# Patient Record
Sex: Female | Born: 1955 | Race: White | Hispanic: No | Marital: Married | Smoking: Never smoker
Health system: Southern US, Community
[De-identification: ages and names within clinical notes are randomized; demographics above are authoritative.]

## PROBLEM LIST (undated history)

## (undated) DIAGNOSIS — M1990 Unspecified osteoarthritis, unspecified site: Secondary | ICD-10-CM

## (undated) DIAGNOSIS — K5790 Diverticulosis of intestine, part unspecified, without perforation or abscess without bleeding: Secondary | ICD-10-CM

## (undated) DIAGNOSIS — R002 Palpitations: Secondary | ICD-10-CM

## (undated) DIAGNOSIS — R0789 Other chest pain: Secondary | ICD-10-CM

## (undated) DIAGNOSIS — T7840XA Allergy, unspecified, initial encounter: Secondary | ICD-10-CM

## (undated) DIAGNOSIS — I10 Essential (primary) hypertension: Secondary | ICD-10-CM

## (undated) DIAGNOSIS — M797 Fibromyalgia: Secondary | ICD-10-CM

## (undated) HISTORY — DX: Other chest pain: R07.89

## (undated) HISTORY — DX: Fibromyalgia: M79.7

## (undated) HISTORY — DX: Essential (primary) hypertension: I10

## (undated) HISTORY — DX: Allergy, unspecified, initial encounter: T78.40XA

## (undated) HISTORY — DX: Palpitations: R00.2

## (undated) HISTORY — DX: Unspecified osteoarthritis, unspecified site: M19.90

## (undated) HISTORY — DX: Diverticulosis of intestine, part unspecified, without perforation or abscess without bleeding: K57.90

## (undated) HISTORY — PX: WISDOM TOOTH EXTRACTION: SHX21

## (undated) HISTORY — PX: CATARACT EXTRACTION: SUR2

## (undated) HISTORY — PX: CHOLECYSTECTOMY: SHX55

---

## 1999-04-27 ENCOUNTER — Emergency Department (HOSPITAL_COMMUNITY): Admission: EM | Admit: 1999-04-27 | Discharge: 1999-04-27 | Payer: Self-pay | Admitting: Internal Medicine

## 1999-11-27 ENCOUNTER — Other Ambulatory Visit: Admission: RE | Admit: 1999-11-27 | Discharge: 1999-11-27 | Payer: Self-pay | Admitting: Obstetrics and Gynecology

## 2002-04-21 ENCOUNTER — Other Ambulatory Visit: Admission: RE | Admit: 2002-04-21 | Discharge: 2002-04-21 | Payer: Self-pay | Admitting: Obstetrics and Gynecology

## 2002-05-03 ENCOUNTER — Ambulatory Visit (HOSPITAL_COMMUNITY): Admission: RE | Admit: 2002-05-03 | Discharge: 2002-05-03 | Payer: Self-pay | Admitting: Gastroenterology

## 2002-05-19 ENCOUNTER — Encounter: Admission: RE | Admit: 2002-05-19 | Discharge: 2002-05-19 | Payer: Self-pay | Admitting: Gastroenterology

## 2002-05-19 ENCOUNTER — Encounter: Payer: Self-pay | Admitting: Gastroenterology

## 2003-04-05 ENCOUNTER — Ambulatory Visit (HOSPITAL_COMMUNITY): Admission: RE | Admit: 2003-04-05 | Discharge: 2003-04-05 | Payer: Self-pay | Admitting: Gastroenterology

## 2003-05-04 ENCOUNTER — Encounter: Payer: Self-pay | Admitting: Gastroenterology

## 2003-05-04 ENCOUNTER — Encounter: Admission: RE | Admit: 2003-05-04 | Discharge: 2003-05-04 | Payer: Self-pay | Admitting: Gastroenterology

## 2003-09-26 ENCOUNTER — Encounter (INDEPENDENT_AMBULATORY_CARE_PROVIDER_SITE_OTHER): Payer: Self-pay

## 2003-09-26 ENCOUNTER — Observation Stay (HOSPITAL_COMMUNITY): Admission: RE | Admit: 2003-09-26 | Discharge: 2003-09-27 | Payer: Self-pay | Admitting: Surgery

## 2003-09-26 ENCOUNTER — Encounter: Payer: Self-pay | Admitting: Surgery

## 2005-01-30 ENCOUNTER — Other Ambulatory Visit: Admission: RE | Admit: 2005-01-30 | Discharge: 2005-01-30 | Payer: Self-pay | Admitting: Obstetrics and Gynecology

## 2005-06-21 ENCOUNTER — Encounter: Admission: RE | Admit: 2005-06-21 | Discharge: 2005-06-21 | Payer: Self-pay | Admitting: Rheumatology

## 2006-01-19 ENCOUNTER — Encounter: Admission: RE | Admit: 2006-01-19 | Discharge: 2006-01-19 | Payer: Self-pay | Admitting: Neurological Surgery

## 2006-02-04 ENCOUNTER — Encounter: Admission: RE | Admit: 2006-02-04 | Discharge: 2006-02-04 | Payer: Self-pay | Admitting: Neurological Surgery

## 2006-02-12 ENCOUNTER — Other Ambulatory Visit: Admission: RE | Admit: 2006-02-12 | Discharge: 2006-02-12 | Payer: Self-pay | Admitting: Obstetrics and Gynecology

## 2006-02-17 ENCOUNTER — Ambulatory Visit (HOSPITAL_COMMUNITY): Admission: RE | Admit: 2006-02-17 | Discharge: 2006-02-17 | Payer: Self-pay | Admitting: Obstetrics and Gynecology

## 2006-03-31 ENCOUNTER — Encounter: Admission: RE | Admit: 2006-03-31 | Discharge: 2006-03-31 | Payer: Self-pay | Admitting: Gastroenterology

## 2007-10-29 ENCOUNTER — Encounter: Admission: RE | Admit: 2007-10-29 | Discharge: 2007-10-29 | Payer: Self-pay | Admitting: Obstetrics and Gynecology

## 2010-04-19 ENCOUNTER — Encounter: Admission: RE | Admit: 2010-04-19 | Discharge: 2010-04-19 | Payer: Self-pay | Admitting: Obstetrics and Gynecology

## 2011-01-12 ENCOUNTER — Encounter: Payer: Self-pay | Admitting: Obstetrics and Gynecology

## 2011-01-12 ENCOUNTER — Encounter: Payer: Self-pay | Admitting: Internal Medicine

## 2011-01-13 ENCOUNTER — Encounter: Payer: Self-pay | Admitting: Obstetrics and Gynecology

## 2011-05-09 NOTE — Op Note (Signed)
   NAME:  Jody Garcia, Jody Garcia                       ACCOUNT NO.:  [id number]   MEDICAL RECORD NO.:  [id number]                   PATIENT TYPE:  AMB   LOCATION:  ENDO                                 FACILITY:   PHYSICIAN:  Anselmo Rod, M.D.               DATE OF BIRTH:  Oct 16, 1956   DATE OF PROCEDURE:  04/05/2003  DATE OF DISCHARGE:                                 OPERATIVE REPORT   PROCEDURE:  Esophagogastroduodenoscopy.   ENDOSCOPIST:  Charna Elizabeth, M.D.   INSTRUMENT USED:  Olympus video panendoscope.   INDICATIONS FOR PROCEDURE:  A 55 year old white female with history of  severe reflux and chest pain, rule out esophagitis, peptic ulcer disease,  etc.   PREPROCEDURE PREPARATION:  Informed consent was procured from the patient.  The patient was fasted for eight hours prior to the procedure.   PREPROCEDURE PHYSICAL:  VITAL SIGNS:  The patient had stable vital signs.  NECK:  Supple.  CHEST:  Clear to auscultation.  CARDIAC:  S1 and S2 regular.  ABDOMEN:  Soft with normal bowel sounds.   DESCRIPTION OF PROCEDURE:  The patient was placed in the left lateral  decubitus position and sedated with 60 mg of Demerol and 6 mg of Versed  intravenously.  Once the patient was adequately sedated, maintained on low  flow oxygen and continuous cardiac monitoring, the Olympus video  panendoscope was advance through the mouth piece over the tongue, into the  esophagus under direct vision.  The entire esophagus appeared normal with no  evidence of rings, stricture, masses, esophagitis or Barrett's mucosa.  The  scope was then advanced into the stomach.  The entire gastric mucosa and the  proximal small bowel appeared normal with no evidence of abnormalities on  high retroflexion.   IMPRESSION:  Normal esophagogastroduodenoscopy.   RECOMMENDATIONS:  1. Continue present medications including Aciphex 20 mg b.i.d.  2. Trial of Librax is being recommended one p.o. b.i.d. #60 with one refill.    These have been called in to the     patient's pharmacy to help with IBS and fibromyalgia.  3. Rheumatologic evaluation by Dr. Pollyann Savoy in the near future.  4. Outpatient followup in the next four weeks for further recommendations.                                               Anselmo Rod, M.D.    JNM/MEDQ  D:  04/05/2003  T:  04/06/2003  Job:  [id number]   cc:   Dois Davenport A. Rivard, M.D.  [address]  [place]  [place] [address]  Fax: [phone]   Pollyann Savoy, M.D.  [address]  [place], [place] [address]  Fax: [phone]

## 2011-05-09 NOTE — Op Note (Signed)
NAME:  Jody Garcia, Jody Garcia                       ACCOUNT NO.:  [id number]   MEDICAL RECORD NO.:  [id number]                   PATIENT TYPE:  AMB   LOCATION:  DAY                                  FACILITY:  [facility]   PHYSICIAN:  Abigail Miyamoto, M.D.              DATE OF BIRTH:  [birth year]   DATE OF PROCEDURE:  09/26/2003  DATE OF DISCHARGE:                                 OPERATIVE REPORT   PREOPERATIVE DIAGNOSES:  Biliary dyskinesia and gallbladder polyp with  abdominal pain.   POSTOPERATIVE DIAGNOSES:  Biliary dyskinesia and gallbladder polyp with  abdominal pain.   PROCEDURE:  Laparoscopic cholecystectomy with intraoperative cholangiogram.   SURGEON:  Abigail Miyamoto, M.D.   ASSISTANT:  Sharlet Salina T. Hoxworth, M.D.   ANESTHESIA:  General endotracheal anesthesia.   ESTIMATED BLOOD LOSS:  Minimal.   FINDINGS:  The patient was found the have a floppy chronically scarred  appearing gallbladder. Cholangiogram was normal.   DESCRIPTION OF PROCEDURE:  The patient was brought to the operating room,  identified as Jody Garcia. She was placed supine on the operating table  and general anesthesia was induced. Her abdomen was then prepped and draped  in the usual sterile fashion. Using a #15 blade, a small transverse incision  was made below the umbilicus. The incision was carried down to the fascia  which was then opened with the scalpel. A hemostat was then used to pass  through the peritoneal cavity. Next, a #0 Vicryl pursestring suture was  placed around the fascial opening. The Hasson port was placed through the  opening and insufflation of the abdomen was begun. Next, a 12 mm port was  placed in the patient's epigastrium and two 5 mm ports were placed in the  patient's right flank under direct vision. The gallbladder was then  visualized. It was found to be quite large and floppy. It was grasped and  elevated above the liver bed. Several adhesions to the gallbladder were  then  taken down bluntly. The cystic duct was then dissected out and clipped once  distally. It was then partly transected with the scissors. An angiocatheter  was then inserted under direct vision in the right upper quadrant. The  cholangiocatheter was passed through this and placed into the opening of the  cystic duct. A cholangiogram was then performed with contrast under direct  fluoroscopy. Contrast was seen to flow into the entire biliary system  without evidence of  abnormality. The cholangiocatheter was then removed.  The cystic duct was then clipped four times proximally and then transected  with scissors. The cystic artery was identified, clipped twice proximally,  once distally and transected as well. The gallbladder was then dissected  free from the liver bed with the electrocautery. Once the gallbladder was  free from the liver bed, hemostasis was achieved with the cautery. The  abdomen was then irrigated with normal saline. Again hemostasis appeared to  be achieved.  All ports were then removed under direct vision and the abdomen  was deflated. All incisions were then anesthetized with 0.25% Marcaine and  closed with 4-0 Monocryl subcuticular sutures. I did  put another #0 Vicryl at the epigastric port in the fascia. Steri-Strips,  gauze and tape were then applied. The patient tolerated the procedure well.  All sponge, needle and instrument counts were correct at the end of the  procedure. The patient was then extubated in the operating room and taken in  a stable condition to the recovery room.                                               Abigail Miyamoto, M.D.    DB/MEDQ  D:  09/26/2003  T:  09/26/2003  Job:  [id number]   cc:   Anselmo Rod, M.D.  [address]  [address]  [place]  [place] [address]  Fax: [phone]

## 2011-05-09 NOTE — Procedures (Signed)
[facility]  Patient:    Jody Garcia, Jody Garcia Visit Number: [id number] MRN: [id number]          Service Type: END Location: ENDO Attending Physician:  Charna Elizabeth Dictated by:   Anselmo Rod, M.D. Proc. Date: 05/03/02 Admit Date:  05/03/2002   CC:         Silverio Lay, M.D.   Procedure Report  DATE OF BIRTH:  [birth year]  REFERRING PHYSICIAN:  Silverio Lay, M.D.  PROCEDURE PERFORMED:  Colonoscopy.  ENDOSCOPIST:  Anselmo Rod, M.D.  INSTRUMENT USED:  Olympus video colonoscope.  INDICATIONS FOR PROCEDURE:  Rectal bleeding with change in bowel habits in a 56 year old white female.  Rule out colonic polyps, masses, hemorrhoids, etc.  PREPROCEDURE PREPARATION:  Informed consent was procured from the patient. The patient was fasted for eight hours prior to the procedure and prepped with a bottle of magnesium citrate and a gallon of NuLytely the night prior to the procedure.  PREPROCEDURE PHYSICAL:  The patient had stable vital signs.  Neck supple. Chest clear to auscultation.  S1, S2 regular.  Abdomen soft with normal bowel sounds.  DESCRIPTION OF PROCEDURE:  The patient was placed in the left lateral decubitus position and sedated with 60 mg of Demerol and 6 mg of Versed intravenously.  Once the patient was adequately sedated and maintained on low-flow oxygen and continuous cardiac monitoring, the Olympus video colonoscope was advanced from the rectum to the cecum without difficulty.  The patient had a few early left-sided diverticula.  Small internal and external hemorrhoids were seen on retroflexion and anal inspection, respectively. There was extrinsic compression of the rectosigmoid area for reasons unclear to me.  No masses or polyps were seen.  The patient tolerated the procedure well without complication.  The appendiceal orifice and ileocecal valve were clearly visualized and photographed.  IMPRESSION: 1. Early  left-sided diverticula. 2. Small nonbleeding internal and external hemorrhoids. 3. Extrinsic compression of the rectosigmoid, question of reason for    this compression.  RECOMMENDATIONS: 1. A high fiber diet has been recommended for the patient. 2. A CT scan of the abdomen and pelvis will be considered to further    evaluate the extrinsic compression of the rectosigmoid area. 3. Gynecological evaluation may also be required to further evaluate    the abnormality seen in the rectosigmoid area. 4. Outpatient follow-up in the next two weeks.Dictated by:   Anselmo Rod, M.D. Attending Physician:  Charna Elizabeth DD:  05/03/02 TD:  05/04/02 Job: [id number]

## 2012-04-07 ENCOUNTER — Other Ambulatory Visit: Payer: Self-pay

## 2012-08-09 ENCOUNTER — Telehealth: Payer: Self-pay | Admitting: Obstetrics and Gynecology

## 2012-08-09 NOTE — Telephone Encounter (Signed)
Swollen area between breasts for x 1 month.  Thought was pulled muscle and is very tender. Hurts to breathe and/or move.  She does have a herniated disk.  Suggested that she sees PCP or urgent care for this problem and keep appt for AEX on 08-30-12.  Pt agreeable.  Pt will need DXA with 08-30-12 appt.  ld

## 2012-08-09 NOTE — Telephone Encounter (Signed)
sr pt 

## 2012-08-16 ENCOUNTER — Ambulatory Visit (INDEPENDENT_AMBULATORY_CARE_PROVIDER_SITE_OTHER): Payer: BC Managed Care – PPO | Admitting: Family Medicine

## 2012-08-16 ENCOUNTER — Ambulatory Visit: Payer: BC Managed Care – PPO

## 2012-08-16 VITALS — BP 118/75 | HR 69 | Temp 98.6°F | Resp 18 | Wt 123.0 lb

## 2012-08-16 DIAGNOSIS — R0781 Pleurodynia: Secondary | ICD-10-CM

## 2012-08-16 DIAGNOSIS — R079 Chest pain, unspecified: Secondary | ICD-10-CM

## 2012-08-16 NOTE — Progress Notes (Signed)
[facility] [address], [place] [address] [phone]  Date:  08/16/2012   Name:  Jody Garcia   DOB:  [birth year]   MRN:  [id number]  PCP:  No primary provider on file.    Chief Complaint: Chest Pain and Back Pain   History of Present Illness:  Jody Garcia is a 56 y.o. very pleasant female patient who presents with the following:  She has noted chest wall tenderness for "at least a month if not more."      She has had a history of right upper back pain and has had an MRI in 2006.  She had ?epidural injections as well.  The feels that her back pain is related to her chest pain.   Moving her arms or head reproduces the chest pain.   She has been using a lot of advil- none for 2 days due to stomach pain.  Lifting anything hurts a lot.   It hurts to take a deep breath or sneeze Resting does help, but does not resolve her pain.   No injury that she is aware of.    History of FBM for about 15 years.  Dr. Corliss Skains has treated her in the past.   History of osteoporosis.   Notes increased pain at night.   Her GYN is helping her with her osteoporosis.  She has not yet started any medication yet.  No history of cancer.  She has kep up with regular mammograms.    Reviewed MRI 2006- there was actually a cyst of some sort near T3, and a repeat MRI was recommended in 4-6 months.  This was never done  There is no problem list on file for this patient.   Past Medical History  Diagnosis Date  . Heart palpitations   . Arthritis   . Osteoporosis   . Fibromyalgia   . Atypical chest pain   . Hypertension     Past Surgical History  Procedure Date  . Cholecystectomy     History  Substance Use Topics  . Smoking status: Never Smoker   . Smokeless tobacco: Not on file  . Alcohol Use: No    Family History  Problem Relation Age of Onset  . Arthritis Mother   . Hypertension Mother   . Arthritis Father   . Irregular heart beat Father   .  Allergies Father   . Diverticulosis Father     Allergies  Allergen Reactions  . Corn-Containing Products   . Oxycodone Itching    Medication list has been reviewed and updated.  Current Outpatient Prescriptions on File Prior to Visit  Medication Sig Dispense Refill  . Calcium Carbonate-Vit D-Min (CALCIUM 1200 PO) Take 1 tablet by mouth daily.      . Cholecalciferol (VITAMIN D-3 PO) Take by mouth.      Marland Kitchen ibuprofen (ADVIL,MOTRIN) 200 MG tablet Take 200 mg by mouth as needed.        Review of Systems:  As per HPI- otherwise negative.   Physical Examination: Filed Vitals:   08/16/12 1014  BP: 118/75  Pulse: 69  Temp: 98.6 F (37 C)  Resp: 18   Filed Vitals:   08/16/12 1014  Weight: 123 lb (55.792 kg)   There is no height on file to calculate BMI. Ideal Body Weight:    GEN: WDWN, NAD, Non-toxic, A & O x 3 HEENT: Atraumatic, Normocephalic. Neck supple. No masses, No LAD. Ears and Nose: No external  deformity. CV: RRR, No M/G/R. No JVD. No thrill. No extra heart sounds. PULM: CTA B, no wheezes, crackles, rhonchi. No retractions. No resp. distress. No accessory muscle use. ABD: S, NT, ND, +BS. No rebound. No HSM. EXTR: No c/c/e NEURO Normal gait.  PSYCH: Normally interactive. Conversant. Not depressed or anxious appearing.  Calm demeanor.  Breast exam: no masses or discharge.   Chest wall: easily reproducible tenderness of the right costochondral area.  Also tender in this area with movement of right shoulder, but has full right shoulder ROM.  No redness or lesion  [facility] reading (PRIMARY) by  Dr. Patsy Lager Chest: Normal. Ribs: normal   CHEST - 2 VIEW  Comparison: None.  Findings: Two-view exam was obtained with a radiopaque marker localizing the area of patient concern. Marker overlies the right aspect of the upper sternum. Hyperexpansion is consistent with emphysema. No edema or focal airspace consolidation. The cardiopericardial silhouette is within normal limits  for size. Bones are demineralized without acute abnormality.  No abnormalities seen in the area of concern over the upper sternum.  IMPRESSION: Emphysema without acute cardiopulmonary findings.  RIGHT RIBS - 2 VIEW  Comparison: None.  Findings: Oblique views of the right ribs were obtained with a radiopaque marker localizing the area of patient concern. No underlying displaced rib fracture is evident. Bones are diffusely demineralized.  IMPRESSION: No acute findings.   Assessment and Plan: 1. Rib pain  DG Chest 2 View, DG Ribs Unilateral W/Chest Right, DG Ribs Unilateral Right  2. 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  Will refer to ortho for possible injection or other treatment.  She never had a repeat MRI from 2006- will refer her for this as well  Abbe Amsterdam, MD

## 2012-08-17 ENCOUNTER — Telehealth: Payer: Self-pay

## 2012-08-17 ENCOUNTER — Encounter: Payer: Self-pay | Admitting: Family Medicine

## 2012-08-17 DIAGNOSIS — M940 Chondrocostal junction syndrome [Tietze]: Secondary | ICD-10-CM

## 2012-08-17 NOTE — Telephone Encounter (Signed)
MIKE STATES DR COPLAND HAD REFERRED PT TO HAVE AN MRI WITH CONSTRAST AND PT HAD ONE ALREADY WITH ANOTHER DR. THEY HAVE REQUESTED THEM TO SEND THE CD TO DR COPLAND AND WANTED TO KNOW IF SHE STILL WANT HER TO HAVE ONE. PLEASE CALL [phone]

## 2012-08-17 NOTE — Telephone Encounter (Signed)
I have called patients husband I have advised we do not need CD, but a report of the scan would be helpful. Patients husband states scan was done in 2007 MRI T spine with contrast, he is asking if this needs to be repeated, or if patient can just use the old scan for reference, please advise if this patient still needs MRI?

## 2012-08-17 NOTE — Telephone Encounter (Signed)
Called today and spoke with husband- Maybel actually did have a follow- up MRI per a neurosurgeon in town about 6 months after her 2006 MRI.  They have already requested the a copy of this MRI report be sent to me.

## 2012-08-26 DIAGNOSIS — K5790 Diverticulosis of intestine, part unspecified, without perforation or abscess without bleeding: Secondary | ICD-10-CM | POA: Insufficient documentation

## 2012-08-27 ENCOUNTER — Other Ambulatory Visit: Payer: Self-pay | Admitting: Physical Medicine and Rehabilitation

## 2012-08-27 DIAGNOSIS — R0789 Other chest pain: Secondary | ICD-10-CM

## 2012-08-30 ENCOUNTER — Other Ambulatory Visit: Payer: Self-pay

## 2012-08-30 ENCOUNTER — Encounter: Payer: Self-pay | Admitting: Obstetrics and Gynecology

## 2012-09-01 MED ORDER — PREDNISONE 20 MG PO TABS: ORAL_TABLET | ORAL | Status: AC

## 2012-09-01 NOTE — Telephone Encounter (Signed)
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  She would like to try a short course of prednisone- will send to her pharmacy.  Let me know if not better!

## 2012-09-01 NOTE — Telephone Encounter (Signed)
Husband, Jody Garcia, dropped off cd of wife, Jody Garcia, MRI results.  Wanted to forward to Dr. Patsy Lager and Kathlene November would like a call @ [phone].    CD in Dr. Cyndie Chime box.

## 2012-09-06 ENCOUNTER — Telehealth: Payer: Self-pay | Admitting: *Deleted

## 2012-09-06 NOTE — Telephone Encounter (Signed)
Called patient to see if Prednisone was helpful and if she was feeling better.  Patient states that she didn't take the Prednisone because she felt better after seeing Dr. Ethelene Hal at [facility].

## 2012-10-27 ENCOUNTER — Ambulatory Visit (INDEPENDENT_AMBULATORY_CARE_PROVIDER_SITE_OTHER): Payer: BC Managed Care – PPO | Admitting: Obstetrics and Gynecology

## 2012-10-27 ENCOUNTER — Other Ambulatory Visit: Payer: Self-pay | Admitting: Obstetrics and Gynecology

## 2012-10-27 ENCOUNTER — Encounter: Payer: Self-pay | Admitting: Obstetrics and Gynecology

## 2012-10-27 VITALS — BP 112/62 | Ht 66.25 in | Wt 124.0 lb

## 2012-10-27 DIAGNOSIS — Z1231 Encounter for screening mammogram for malignant neoplasm of breast: Secondary | ICD-10-CM

## 2012-10-27 DIAGNOSIS — Z124 Encounter for screening for malignant neoplasm of cervix: Secondary | ICD-10-CM

## 2012-10-27 DIAGNOSIS — Z01419 Encounter for gynecological examination (general) (routine) without abnormal findings: Secondary | ICD-10-CM

## 2012-10-27 NOTE — Progress Notes (Signed)
The patient is not taking hormone replacement therapy The patient  is taking a Calcium supplement. Post-menopausal bleeding:no  Last Pap: approximate date 04/24/2011 and was normal Last mammogram: approximate date 04/19/2010 and was normal  Last DEXA scan : T= -3.7 (right hip)    April  2012 Last colonoscopy:normal 10 years ago per pt  Urinary symptoms: none Normal bowel movements: Yes Reports abuse at home: No:   Pt was scheduled for a knot between breast but was seen at urgent care. Urgent care referred to Orthopedic. Was given prednisone. Pt states that she never took rx. States that the issue has resolved. Pt also states that she is currently taking Abx for sinus infection, but unsure of the name.   Subjective:    Jody Garcia is a 56 y.o. female G4P4 who presents for annual exam.  The patient has no complaints today.   The following portions of the patient's history were reviewed and updated as appropriate: allergies, current medications, past family history, past medical history, past social history, past surgical history and problem list.  Review of Systems Pertinent items are noted in HPI. Gastrointestinal:No change in bowel habits, no abdominal pain, no rectal bleeding Genitourinary:negative for dysuria, frequency, hematuria, nocturia and urinary incontinence    Objective:     BP 112/62  Ht 5' 6.25" (1.683 m)  Wt 124 lb (56.246 kg)  BMI 19.86 kg/m2  Weight:  Wt Readings from Last 1 Encounters:  10/27/12 124 lb (56.246 kg)     BMI: Body mass index is 19.86 kg/(m^2). General Appearance: Alert, appropriate appearance for age. No acute distress HEENT: Grossly normal Neck / Thyroid: Supple, no masses, nodes or enlargement Lungs: clear to auscultation bilaterally Back: No CVA tenderness Breast Exam: No masses or nodes.No dimpling, nipple retraction or discharge. Cardiovascular: Regular rate and rhythm. S1, S2, no murmur Gastrointestinal: Soft, non-tender, no masses  or organomegaly Pelvic Exam: Vulva and vagina appear normal. Bimanual exam reveals normal uterus and adnexa. Rectovaginal: normal rectal, no masses Lymphatic Exam: Non-palpable nodes in neck, clavicular, axillary, or inguinal regions Skin: no rash or abnormalities Neurologic: Normal gait and speech, no tremor  Psychiatric: Alert and oriented, appropriate affect.     Assessment:    Normal gyn exam    Plan:   mammogram pap smear return annually or prn DEXA next year    Silverio Lay MD

## 2012-10-29 LAB — PAP IG W/ RFLX HPV ASCU

## 2012-12-17 ENCOUNTER — Encounter: Payer: Self-pay | Admitting: Cardiology

## 2012-12-20 ENCOUNTER — Ambulatory Visit: Payer: Self-pay

## 2013-02-11 LAB — CBC AND DIFFERENTIAL
Hemoglobin: 13.6 g/dL (ref 12.0–16.0)
WBC: 5.6 10^3/mL

## 2013-02-11 LAB — BASIC METABOLIC PANEL
BUN: 13 mg/dL (ref 4–21)
Creatinine: 0.7 mg/dL (ref <=1.1)
Glucose: 75 mg/dL
Sodium: 141 mmol/L (ref 137–147)

## 2013-02-11 LAB — HEPATIC FUNCTION PANEL
ALT: 18 U/L (ref 7–35)
AST: 18 U/L (ref 13–35)
Alkaline Phosphatase: 48 U/L (ref 25–125)

## 2013-03-11 ENCOUNTER — Ambulatory Visit (INDEPENDENT_AMBULATORY_CARE_PROVIDER_SITE_OTHER): Payer: BC Managed Care – PPO | Admitting: Family Medicine

## 2013-03-11 VITALS — BP 110/66 | HR 76 | Temp 98.4°F | Resp 17 | Ht 66.0 in | Wt 127.2 lb

## 2013-03-11 DIAGNOSIS — J019 Acute sinusitis, unspecified: Secondary | ICD-10-CM

## 2013-03-11 DIAGNOSIS — J069 Acute upper respiratory infection, unspecified: Secondary | ICD-10-CM

## 2013-03-11 MED ORDER — CEFDINIR 300 MG PO CAPS: 300.0000 mg | ORAL_CAPSULE | Freq: Two times a day (BID) | ORAL | Status: DC

## 2013-03-11 NOTE — Progress Notes (Signed)
Subjective: 57 year old lady with history of feeling like she's been sick for a month. Over the last week or so she's had a lot of sinus symptoms with discomfort and purulent drainage. She has had some popping in her years and had some left otalgia last night. She's had some sore throat yesterday. She is not coughing a lot. She does have a history of fibromyalgia, in hurting her back some.  Objective: Pleasant lady in no major distress. TMs are normal. Nose congested. Sinuses not severely tender. Throat is clear. Neck supple without nodes. Chest is clear to auscultation. Heart regular without murmurs.  Assessment: URI with secondary sinusitis  Plan: This been going on long enough that I think antibiotics are well warranted.

## 2013-03-11 NOTE — Patient Instructions (Addendum)
Drink plenty of fluids  Take antibiotics as prescribed  Get sufficient rest  Return if worse  Use Polysporin rather than Neosporin in the nose as needed.

## 2013-05-03 ENCOUNTER — Encounter: Payer: Self-pay | Admitting: *Deleted

## 2013-11-03 ENCOUNTER — Ambulatory Visit (INDEPENDENT_AMBULATORY_CARE_PROVIDER_SITE_OTHER): Payer: BC Managed Care – PPO | Admitting: Family Medicine

## 2013-11-03 VITALS — BP 122/70 | HR 80 | Temp 98.2°F | Resp 16 | Ht 66.0 in | Wt 123.0 lb

## 2013-11-03 DIAGNOSIS — R509 Fever, unspecified: Secondary | ICD-10-CM

## 2013-11-03 DIAGNOSIS — R05 Cough: Secondary | ICD-10-CM

## 2013-11-03 DIAGNOSIS — J09X2 Influenza due to identified novel influenza A virus with other respiratory manifestations: Secondary | ICD-10-CM

## 2013-11-03 DIAGNOSIS — R059 Cough, unspecified: Secondary | ICD-10-CM

## 2013-11-03 LAB — POCT INFLUENZA A/B: Influenza B, POC: NEGATIVE

## 2013-11-03 MED ORDER — HYDROCODONE-HOMATROPINE 5-1.5 MG/5ML PO SYRP: 5.0000 mL | ORAL_SOLUTION | ORAL | Status: DC | PRN

## 2013-11-03 MED ORDER — OSELTAMIVIR PHOSPHATE 75 MG PO CAPS: 75.0000 mg | ORAL_CAPSULE | Freq: Two times a day (BID) | ORAL | Status: DC

## 2013-11-03 NOTE — Progress Notes (Signed)
Subjective: Jody Garcia is a 57 year old lady known to me outside of here. She has been sick for several days. Actually for couple of weeks she felt a little sinus discomfort. Today she reports that for the past few days she's had chills body ache, left facial pain today, coughing congestion. No nausea or vomiting. She does not smoke. She is generally healthy. She is not on any regular medications. She did not get a flu shot. Temperature up to 100.5 at home Objective: Pleasant alert lady in no major distress but she is obviously congested. Her TMs are normal. Nose congested. She is sniffling. Her throat is clear. Neck supple without significant nodes. Chest is clear to auscultation. Heart regular without murmurs. No rashes.  Assessment: Viral syndrome with probable left maxillary sinusitis  Plan: Flu swab  Results for orders placed in visit on 11/03/13  POCT INFLUENZA A/B      Result Value Range   Influenza A, POC Positive     Influenza B, POC Negative     Influenza A

## 2013-11-03 NOTE — Patient Instructions (Signed)

## 2013-11-08 ENCOUNTER — Telehealth: Payer: Self-pay

## 2013-11-08 NOTE — Telephone Encounter (Signed)
Pt is starting to feel better. Today is her last dose of Tamiflu. She reports that her sinus pressure has not resolved. She has increased sinus pressure on her left side with noted green mucus drainage and sputum. Can we call her in an antibiotic?

## 2013-11-08 NOTE — Telephone Encounter (Signed)
Pt states she was diagnosed with flu and now she has green mucous and is requesting an antibiotic,   Best phone [phone]  Pharmacy [facility] at [address].

## 2013-11-09 MED ORDER — CEFDINIR 300 MG PO CAPS: 300.0000 mg | ORAL_CAPSULE | Freq: Two times a day (BID) | ORAL | Status: DC

## 2013-11-09 NOTE — Telephone Encounter (Signed)
Call patient:  Symptoms may still be just from the virus.  However, we will place on an antibiotic.  Return if not improving.  RX:  Omnicef 300 #20 one twice daily

## 2013-11-09 NOTE — Telephone Encounter (Signed)
Called her to advise. Advised also for her to try mucinex.

## 2014-01-17 ENCOUNTER — Ambulatory Visit: Payer: BC Managed Care – PPO

## 2014-01-17 ENCOUNTER — Ambulatory Visit (INDEPENDENT_AMBULATORY_CARE_PROVIDER_SITE_OTHER): Payer: BC Managed Care – PPO | Admitting: Family Medicine

## 2014-01-17 VITALS — BP 122/78 | HR 107 | Temp 98.6°F | Resp 17 | Ht 66.0 in | Wt 122.0 lb

## 2014-01-17 DIAGNOSIS — G479 Sleep disorder, unspecified: Secondary | ICD-10-CM

## 2014-01-17 DIAGNOSIS — H9319 Tinnitus, unspecified ear: Secondary | ICD-10-CM

## 2014-01-17 DIAGNOSIS — H93A9 Pulsatile tinnitus, unspecified ear: Secondary | ICD-10-CM

## 2014-01-17 DIAGNOSIS — R079 Chest pain, unspecified: Secondary | ICD-10-CM

## 2014-01-17 DIAGNOSIS — M542 Cervicalgia: Secondary | ICD-10-CM

## 2014-01-17 DIAGNOSIS — R002 Palpitations: Secondary | ICD-10-CM

## 2014-01-17 MED ORDER — TRAZODONE HCL 50 MG PO TABS: ORAL_TABLET | ORAL | Status: DC

## 2014-01-17 NOTE — Progress Notes (Signed)
Subjective: Patient is here with several complaints. Things actually been going on for years, but some more recently. She has a history of fibromyalgia. She has been hurting down the whole left side of her body. It's like you to cut the body and haptics the left side that she has headaches, neck and arm and chest pains, left leg swelling and pain her biggest concern however is that she's been having pulsatile tinnitus. This is noticed primarily when she is seated quietly or lying down quietly, a whooshing noise in the ears.  The neck hurts, somewhat nonspecific. She has headaches, primarily in the back of her head  Objective: Alert and oriented. Neck has good range of motion. TMs are normal. Eyes PERRLA. Fundi benign. Throat clear. Neck supple without nodes thyromegaly. No carotid bruits or pulsations could be heard. Heart regular without murmurs. Extremities grossly normal.  Assessment: Pulsatile tinnitus  Headache Left sided pains Chest pain  Plan: ekg Cervical spine Labs normal at gyn recently.

## 2014-01-17 NOTE — Patient Instructions (Signed)
Take the trazodone 25 mg one at bedtime. If after about a week you do not feel like it's really helping you to rest better, try going on up to 50 mg   Someone will contact you with the scheduling of the circulation study for you neck

## 2014-02-13 ENCOUNTER — Other Ambulatory Visit (HOSPITAL_COMMUNITY): Payer: Self-pay | Admitting: Family Medicine

## 2014-02-13 ENCOUNTER — Ambulatory Visit (HOSPITAL_COMMUNITY)
Admission: RE | Admit: 2014-02-13 | Discharge: 2014-02-13 | Disposition: A | Discharge status: Home / Self Care | Payer: BC Managed Care – PPO | Source: Ambulatory Visit | Attending: Family Medicine | Admitting: Family Medicine

## 2014-02-13 DIAGNOSIS — H9319 Tinnitus, unspecified ear: Secondary | ICD-10-CM | POA: Insufficient documentation

## 2014-02-13 DIAGNOSIS — H93A9 Pulsatile tinnitus, unspecified ear: Secondary | ICD-10-CM

## 2014-02-13 NOTE — Progress Notes (Signed)
*  PRELIMINARY RESULTS* Vascular Ultrasound Carotid Duplex (Doppler) has been completed.  Preliminary findings: Bilateral:  1-39% ICA stenosis.  Vertebral artery flow is antegrade.      Farrel DemarkJill Eunice, RDMS, RVT  02/13/2014, 11:03 AM

## 2014-02-21 ENCOUNTER — Telehealth: Payer: Self-pay | Admitting: Family Medicine

## 2014-02-21 NOTE — Telephone Encounter (Signed)
It was done on 02/13/14 and we gave pt results.  You can see it under the procedure tab.

## 2014-02-21 NOTE — Telephone Encounter (Signed)
Message copied by Blima LedgerICHARDSON, SHEKETIA D on Tue Feb 21, 2014 11:08 AM ------      Message from: HOPPER, DAVID H      Created: Tue Feb 21, 2014  9:44 AM      Regarding: Was test ever ordered/done       Patient had a carotid doppler I thought was scheduled but I cannot find that it was ever done.  Please check on this, and check with patient if needed. ------

## 2014-03-23 ENCOUNTER — Other Ambulatory Visit: Payer: Self-pay | Admitting: Family Medicine

## 2014-03-23 DIAGNOSIS — IMO0002 Reserved for concepts with insufficient information to code with codable children: Secondary | ICD-10-CM

## 2014-03-28 ENCOUNTER — Ambulatory Visit
Admission: RE | Admit: 2014-03-28 | Discharge: 2014-03-28 | Disposition: A | Discharge status: Home / Self Care | Payer: BC Managed Care – PPO | Source: Ambulatory Visit | Attending: Family Medicine | Admitting: Family Medicine

## 2014-03-28 ENCOUNTER — Other Ambulatory Visit: Payer: Self-pay | Admitting: Family Medicine

## 2014-03-28 DIAGNOSIS — IMO0002 Reserved for concepts with insufficient information to code with codable children: Secondary | ICD-10-CM

## 2014-10-04 ENCOUNTER — Other Ambulatory Visit: Payer: Self-pay

## 2014-10-04 DIAGNOSIS — Z1239 Encounter for other screening for malignant neoplasm of breast: Secondary | ICD-10-CM

## 2014-10-31 ENCOUNTER — Ambulatory Visit
Admission: RE | Admit: 2014-10-31 | Discharge: 2014-10-31 | Disposition: A | Discharge status: Home / Self Care | Payer: BC Managed Care – PPO | Source: Ambulatory Visit

## 2014-10-31 ENCOUNTER — Encounter (INDEPENDENT_AMBULATORY_CARE_PROVIDER_SITE_OTHER): Payer: Self-pay

## 2014-10-31 DIAGNOSIS — Z1239 Encounter for other screening for malignant neoplasm of breast: Secondary | ICD-10-CM

## 2016-03-09 ENCOUNTER — Ambulatory Visit (INDEPENDENT_AMBULATORY_CARE_PROVIDER_SITE_OTHER): Payer: BLUE CROSS/BLUE SHIELD | Admitting: Physician Assistant

## 2016-03-09 VITALS — BP 118/66 | HR 80 | Temp 98.1°F | Resp 16 | Ht 66.0 in | Wt 121.0 lb

## 2016-03-09 DIAGNOSIS — R1032 Left lower quadrant pain: Secondary | ICD-10-CM | POA: Diagnosis not present

## 2016-03-09 DIAGNOSIS — R509 Fever, unspecified: Secondary | ICD-10-CM | POA: Diagnosis not present

## 2016-03-09 DIAGNOSIS — M797 Fibromyalgia: Secondary | ICD-10-CM | POA: Insufficient documentation

## 2016-03-09 DIAGNOSIS — J0100 Acute maxillary sinusitis, unspecified: Secondary | ICD-10-CM | POA: Diagnosis not present

## 2016-03-09 LAB — POCT CBC
Granulocyte percent: 69.1 %G (ref 37–80)
HCT, POC: 39.3 % (ref 37.7–47.9)
Hemoglobin: 14.3 g/dL (ref 12.2–16.2)
LYMPH, POC: 1.1 (ref 0.6–3.4)
MCH, POC: 31.6 pg — AB (ref 27–31.2)
MCHC: 36.5 g/dL — AB (ref 31.8–35.4)
MCV: 86.7 fL (ref 80–97)
MID (cbc): 0.3 (ref 0–0.9)
MPV: 7.3 fL (ref 0–99.8)
PLATELET COUNT, POC: 183 10*3/uL (ref 142–424)
POC Granulocyte: 3.2 (ref 2–6.9)
POC LYMPH %: 23.3 % (ref 10–50)
POC MID %: 7.6 % (ref 0–12)
RBC: 4.53 M/uL (ref 4.04–5.48)
RDW, POC: 13.1 %
WBC: 4.6 10*3/uL (ref 4.6–10.2)

## 2016-03-09 LAB — POC MICROSCOPIC URINALYSIS (UMFC): Mucus: ABSENT

## 2016-03-09 LAB — POCT URINALYSIS DIP (MANUAL ENTRY)
Bilirubin, UA: NEGATIVE
Blood, UA: NEGATIVE
Glucose, UA: NEGATIVE
Leukocytes, UA: NEGATIVE
Nitrite, UA: NEGATIVE
Protein Ur, POC: NEGATIVE
Spec Grav, UA: <=1.005
Urobilinogen, UA: 0.2
pH, UA: 5

## 2016-03-09 MED ORDER — AMOXICILLIN-POT CLAVULANATE 875-125 MG PO TABS: 1.0000 | ORAL_TABLET | Freq: Two times a day (BID) | ORAL | Status: DC

## 2016-03-09 MED ORDER — GUAIFENESIN ER 1200 MG PO TB12: 1.0000 | ORAL_TABLET | Freq: Two times a day (BID) | ORAL | Status: AC

## 2016-03-09 NOTE — Patient Instructions (Signed)
Please push fluids.  Tylenol and Motrin for fever and body aches.    A humidifier can help especially when the air is dry -if you do not have a humidifier you can boil a pot of water on the stove in your home to help with the dry air.  Mucinex 1200mg  2x/day in the future for sinus pressure Aquaphor or vaseline for nasal moisture and to help the scab to heal    IF you received an x-ray today, you will receive an invoice from [facility]. Please contact [facility] at [phone] with questions or concerns regarding your invoice.   IF you received labwork today, you will receive an invoice from [facility]. Please contact [facility] at [phone] with questions or concerns regarding your invoice.   Our billing staff will not be able to assist you with questions regarding bills from these companies.  You will be contacted with the lab results as soon as they are available. The fastest way to get your results is to activate your My Chart account. Instructions are located on the last page of this paperwork. If you have not heard from us regarding the results in 2 weeks, please contact this office.

## 2016-03-09 NOTE — Progress Notes (Signed)
Jody Garcia  MRN: [id number] DOB: Jul 07, 1956  Subjective:  Pt presents to clinic with h/o lower abd pain for the last 4 days.  Overall she feels like she is getting better but she is not well.  She had fevers 3 days ago and they resolved yesterday and that worried her.  She did not sleep well last night.  She has h/o diverticulosis and she has had similar symptoms in the past but she increases her fluids and has a low bulk diet and it does away but this one seems worse than the ones in the past.    She is also having continued nasal congestion and pressure.  She gets this every spring - she thinks it is from the dry air during the winter and she has a known deviated nasal septum to the left side and been seen by ENT in the past.  She has sinus pressure that worsens when she leans over.  She has some headaches but no dizziness.  She also mowed the grass last week right before this got worse - she has no know seasonal allergies.  Normal BMs - since her pain started Colonoscopy - diverticulosis - ate lot of popcorn and peanuts last week -   Patient Active Problem List   Diagnosis Date Noted  . Fibromyalgia 03/09/2016  . Diverticulosis     No current outpatient prescriptions on file prior to visit.   No current facility-administered medications on file prior to visit.    Allergies  Allergen Reactions  . Corn-Containing Products   . Oxycodone Itching    Review of Systems  Constitutional: Positive for fever (3 days ago - now resolved). Negative for chills.  HENT: Positive for nosebleeds (just out of left side - gets scabbed over and then irritates her), rhinorrhea (green) and sinus pressure (left max sinus).   Respiratory: Negative for cough.   Gastrointestinal: Positive for abdominal pain. Negative for nausea, diarrhea and constipation.   Objective:  BP 118/66 mmHg  Pulse 80  Temp(Src) 98.1 F (36.7 C) (Oral)  Resp 16  Ht  (1.676 m)  Wt 121 lb (54.885 kg)  BMI 19.54  kg/m2  SpO2 98%  Physical Exam  Constitutional: She is oriented to person, place, and time and well-developed, well-nourished, and in no distress.  HENT:  Head: Normocephalic and atraumatic.  Right Ear: Hearing, tympanic membrane, external ear and ear canal normal.  Left Ear: Hearing, tympanic membrane, external ear and ear canal normal.  Nose: Left sinus exhibits maxillary sinus tenderness (mild).  Mouth/Throat: Uvula is midline, oropharynx is clear and moist and mucous membranes are normal.  Eyes: Conjunctivae are normal.  Neck: Normal range of motion.  Cardiovascular: Normal rate, regular rhythm and normal heart sounds.   No murmur heard. Pulmonary/Chest: Effort normal and breath sounds normal.  Abdominal: Soft. Bowel sounds are normal. There is tenderness (LLQ TTP). There is CVA tenderness (mild on the left side). There is no rebound and no guarding.  Neurological: She is alert and oriented to person, place, and time. Gait normal.  Skin: Skin is warm and dry.  Psychiatric: Mood, memory, affect and judgment normal.  Vitals reviewed.   Results for orders placed or performed in visit on 03/09/16  POCT urinalysis dipstick  Result Value Ref Range   Color, UA yellow yellow   Clarity, UA clear clear   Glucose, UA negative negative   Bilirubin, UA negative negative   Ketones, POC UA small (15) (A) negative   Spec  Grav, UA <=1.005    Blood, UA negative negative   pH, UA 5.0    Protein Ur, POC negative negative   Urobilinogen, UA 0.2    Nitrite, UA Negative Negative   Leukocytes, UA Negative Negative  POCT Microscopic Urinalysis ([facility])  Result Value Ref Range   WBC,UR,HPF,POC None None WBC/hpf   RBC,UR,HPF,POC None None RBC/hpf   Bacteria None None, Too numerous to count   Mucus Absent Absent   Epithelial Cells, UR Per Microscopy Few (A) None, Too numerous to count cells/hpf  POCT CBC  Result Value Ref Range   WBC 4.6 4.6 - 10.2 K/uL   Lymph, poc 1.1 0.6 - 3.4   POC LYMPH  PERCENT 23.3 10 - 50 %L   MID (cbc) 0.3 0 - 0.9   POC MID % 7.6 0 - 12 %M   POC Granulocyte 3.2 2 - 6.9   Granulocyte percent 69.1 37 - 80 %G   RBC 4.53 4.04 - 5.48 M/uL   Hemoglobin 14.3 12.2 - 16.2 g/dL   HCT, POC 16.139.3 09.637.7 - 47.9 %   MCV 86.7 80 - 97 fL   MCH, POC 31.6 (A) 27 - 31.2 pg   MCHC 36.5 (A) 31.8 - 35.4 g/dL   RDW, POC 04.513.1 %   Platelet Count, POC 183 142 - 424 K/uL   MPV 7.3 0 - 99.8 fL    Assessment and Plan :  Acute maxillary sinusitis, recurrence not specified - Plan: Guaifenesin (MUCINEX MAXIMUM STRENGTH) 1200 MG TB12, amoxicillin-clavulanate (AUGMENTIN) 875-125 MG tablet, Care order/instruction - symptomatic treatment d/w pt - also discussed what she can do in the future to prevent sinus dryness  LLQ pain - Plan: POCT urinalysis dipstick, POCT Microscopic Urinalysis ([facility]), POCT CBC - ? Resolving diverticulitis - she has a normal CBC and urine today - she will continue what she has been doing esp since she is feeling better - we discussed warning signs and when to RTC  Fever, unspecified fever cause - Plan: POCT CBC    Benny LennertSarah Weber PA-C  [facility] 03/09/2016 9:11 AM

## 2016-12-23 ENCOUNTER — Ambulatory Visit (INDEPENDENT_AMBULATORY_CARE_PROVIDER_SITE_OTHER): Payer: Self-pay | Admitting: Family Medicine

## 2016-12-23 ENCOUNTER — Encounter: Payer: Self-pay | Admitting: Family Medicine

## 2016-12-23 VITALS — BP 102/66 | HR 71 | Temp 98.3°F | Resp 18 | Ht 66.0 in | Wt 123.0 lb

## 2016-12-23 DIAGNOSIS — J0120 Acute ethmoidal sinusitis, unspecified: Secondary | ICD-10-CM

## 2016-12-23 DIAGNOSIS — H9319 Tinnitus, unspecified ear: Secondary | ICD-10-CM

## 2016-12-23 MED ORDER — AMOXICILLIN-POT CLAVULANATE 875-125 MG PO TABS: 1.0000 | ORAL_TABLET | Freq: Two times a day (BID) | ORAL | 0 refills | Status: DC

## 2016-12-23 MED ORDER — FLUTICASONE PROPIONATE 50 MCG/ACT NA SUSP: 2.0000 | Freq: Every day | NASAL | 5 refills | Status: AC

## 2016-12-23 NOTE — Patient Instructions (Addendum)
Hot showers or breathing in steam - add garlic to a pot of boiling water, a humidifier, and even hot tea have all shown to help as much as many over-the-counter meds and may help loosen the congestion.  Using a netti pot or sinus rinse is also likely to help you feel better and keep this from progressing.  Use the nasal saline spray as needed throughout the day and use the fluticasone nasal spray every night before bed for at least 2 weeks.  I recommend augmenting with 12 hr sudafed (behind the counter) to help you move out the congestion.  If no improvement or you are getting worse, come back as you might need a course of steroids but hopefully with all of the above, you can avoid it.     IF you received an x-ray today, you will receive an invoice from [facility]. Please contact [facility] at [phone] with questions or concerns regarding your invoice.   IF you received labwork today, you will receive an invoice from [place]. Please contact [facility] at [phone] with questions or concerns regarding your invoice.   Our billing staff will not be able to assist you with questions regarding bills from these companies.  You will be contacted with the lab results as soon as they are available. The fastest way to get your results is to activate your My Chart account. Instructions are located on the last page of this paperwork. If you have not heard from [place] regarding the results in 2 weeks, please contact this office.    We recommend that you schedule a mammogram for breast cancer screening. Typically, you do not need a referral to do this. Please contact a local imaging center to schedule your mammogram.  [facility] - [phone]  *ask for the Radiology Department [facility]) - [phone] or [phone]  [facility] - [phone] [facility] - [phone] [facility] - [phone]  *ask for the  Radiology Department [facility] - [phone]  *ask for the Radiology Department [facility] - [phone]  *ask for the Mammography Department [facility] - [phone]   Sinusitis, Adult Sinusitis is soreness and inflammation of your sinuses. Sinuses are hollow spaces in the bones around your face. Your sinuses are located:  Around your eyes.  In the middle of your forehead.  Behind your nose.  In your cheekbones. Your sinuses and nasal passages are lined with a stringy fluid (mucus). Mucus normally drains out of your sinuses. When your nasal tissues become inflamed or swollen, the mucus can become trapped or blocked so air cannot flow through your sinuses. This allows bacteria, viruses, and funguses to grow, which leads to infection. Sinusitis can develop quickly and last for 7?10 days (acute) or for more than 12 weeks (chronic). Sinusitis often develops after a cold. What are the causes? This condition is caused by anything that creates swelling in the sinuses or stops mucus from draining, including:  Allergies.  Asthma.  Bacterial or viral infection.  Abnormally shaped bones between the nasal passages.  Nasal growths that contain mucus (nasal polyps).  Narrow sinus openings.  Pollutants, such as chemicals or irritants in the air.  A foreign object stuck in the nose.  A fungal infection. This is rare. What increases the risk? The following factors may make you more likely to develop this condition:  Having allergies or asthma.  Having had a recent  cold or respiratory tract infection.  Having structural deformities or blockages in your nose or sinuses.  Having a weak immune system.  Doing a lot of swimming or diving.  Overusing nasal sprays.  Smoking. What are the signs or symptoms? The main symptoms of this condition are pain and a feeling of pressure around the affected sinuses. Other symptoms include:  Upper  toothache.  Earache.  Headache.  Bad breath.  Decreased sense of smell and taste.  A cough that may get worse at night.  Fatigue.  Fever.  Thick drainage from your nose. The drainage is often green and it may contain pus (purulent).  Stuffy nose or congestion.  Postnasal drip. This is when extra mucus collects in the throat or back of the nose.  Swelling and warmth over the affected sinuses.  Sore throat.  Sensitivity to light. How is this diagnosed? This condition is diagnosed based on symptoms, a medical history, and a physical exam. To find out if your condition is acute or chronic, your health care provider may:  Look in your nose for signs of nasal polyps.  Tap over the affected sinus to check for signs of infection.  View the inside of your sinuses using an imaging device that has a light attached (endoscope). If your health care provider suspects that you have chronic sinusitis, you may also:  Be tested for allergies.  Have a sample of mucus taken from your nose (nasal culture) and checked for bacteria.  Have a mucus sample examined to see if your sinusitis is related to an allergy. If your sinusitis does not respond to treatment and it lasts longer than 8 weeks, you may have an MRI or CT scan to check your sinuses. These scans also help to determine how severe your infection is. In rare cases, a bone biopsy may be done to rule out more serious types of fungal sinus disease. How is this treated? Treatment for sinusitis depends on the cause and whether your condition is chronic or acute. If a virus is causing your sinusitis, your symptoms will go away on their own within 10 days. You may be given medicines to relieve your symptoms, including:  Topical nasal decongestants. They shrink swollen nasal passages and let mucus drain from your sinuses.  Antihistamines. These drugs block inflammation that is triggered by allergies. This can help to ease swelling in your  nose and sinuses.  Topical nasal corticosteroids. These are nasal sprays that ease inflammation and swelling in your nose and sinuses.  Nasal saline washes. These rinses can help to get rid of thick mucus in your nose. If your condition is caused by bacteria, you will be given an antibiotic medicine. If your condition is caused by a fungus, you will be given an antifungal medicine. Surgery may be needed to correct underlying conditions, such as narrow nasal passages. Surgery may also be needed to remove polyps. Follow these instructions at home: Medicines  Take, use, or apply over-the-counter and prescription medicines only as told by your health care provider. These may include nasal sprays.  If you were prescribed an antibiotic medicine, take it as told by your health care provider. Do not stop taking the antibiotic even if you start to feel better. Hydrate and Humidify  Drink enough water to keep your urine clear or pale yellow. Staying hydrated will help to thin your mucus.  Use a cool mist humidifier to keep the humidity level in your home above 50%.  Inhale steam for 10-15  minutes, 3-4 times a day or as told by your health care provider. You can do this in the bathroom while a hot shower is running.  Limit your exposure to cool or dry air. Rest  Rest as much as possible.  Sleep with your head raised (elevated).  Make sure to get enough sleep each night. General instructions  Apply a warm, moist washcloth to your face 3-4 times a day or as told by your health care provider. This will help with discomfort.  Wash your hands often with soap and water to reduce your exposure to viruses and other germs. If soap and water are not available, use hand sanitizer.  Do not smoke. Avoid being around people who are smoking (secondhand smoke).  Keep all follow-up visits as told by your health care provider. This is important. Contact a health care provider if:  You have a  fever.  Your symptoms get worse.  Your symptoms do not improve within 10 days. Get help right away if:  You have a severe headache.  You have persistent vomiting.  You have pain or swelling around your face or eyes.  You have vision problems.  You develop confusion.  Your neck is stiff.  You have trouble breathing. This information is not intended to replace advice given to you by your health care provider. Make sure you discuss any questions you have with your health care provider. Document Released: 12/08/2005 Document Revised: 08/03/2016 Document Reviewed: 10/03/2015 Elsevier Interactive Patient Education  2017 [employer]. Tinnitus Tinnitus refers to hearing a sound when there is no actual source for that sound. This is often described as ringing in the ears. However, people with this condition may hear a variety of noises. A person may hear the sound in one ear or in both ears. The sounds of tinnitus can be soft, loud, or somewhere in between. Tinnitus can last for a few seconds or can be constant for days. It may go away without treatment and come back at various times. When tinnitus is constant or happens often, it can lead to other problems, such as trouble sleeping and trouble concentrating. Almost everyone experiences tinnitus at some point. Tinnitus that is long-lasting (chronic) or comes back often is a problem that may require medical attention. What are the causes? The cause of tinnitus is often not known. In some cases, it can result from other problems or conditions, including:  Exposure to loud noises from machinery, music, or other sources.  Hearing loss.  Ear or sinus infections.  Earwax buildup.  A foreign object in the ear.  Use of certain medicines.  Use of alcohol and caffeine.  High blood pressure.  Heart diseases.  Anemia.  Allergies.  Meniere disease.  Thyroid problems.  Tumors.  An enlarged part of a weakened blood vessel  (aneurysm). What are the signs or symptoms? The main symptom of tinnitus is hearing a sound when there is no source for that sound. It may sound like:  Buzzing.  Roaring.  Ringing.  Blowing air, similar to the sound heard when you listen to a seashell.  Hissing.  Whistling.  Sizzling.  Humming.  Running water.  A sustained musical note. How is this diagnosed? Tinnitus is diagnosed based on your symptoms. Your health care provider will do a physical exam. A comprehensive hearing exam (audiologic exam) will be done if your tinnitus:  Affects only one ear (unilateral).  Causes hearing difficulties.  Lasts 6 months or longer. You may also need to  see a health care provider who specializes in hearing disorders (audiologist). You may be asked to complete a questionnaire to determine the severity of your tinnitus. Tests may be done to help determine the cause and to rule out other conditions. These can include:  Imaging studies of your head and brain, such as:  A CT scan.  An MRI.  An imaging study of your blood vessels (angiogram). How is this treated? Treating an underlying medical condition can sometimes make tinnitus go away. If your tinnitus continues, other treatments may include:  Medicines, such as certain antidepressants or sleeping aids.  Sound generators to mask the tinnitus. These include:  Tabletop sound machines that play relaxing sounds to help you fall asleep.  Wearable devices that fit in your ear and play sounds or music.  A small device that uses headphones to deliver a signal embedded in music (acoustic neural stimulation). In time, this may change the pathways of your brain and make you less sensitive to tinnitus. This device is used for very severe cases when no other treatment is working.  Therapy and counseling to help you manage the stress of living with tinnitus.  Using hearing aids or cochlear implants, if your tinnitus is related to hearing  loss. Follow these instructions at home:  When possible, avoid being in loud places and being exposed to loud sounds.  Wear hearing protection, such as earplugs, when you are exposed to loud noises.  Do not take stimulants, such as nicotine, alcohol, or caffeine.  Practice techniques for reducing stress, such as meditation, yoga, or deep breathing.  Use a white noise machine, a humidifier, or other devices to mask the sound of tinnitus.  Sleep with your head slightly raised. This may reduce the impact of tinnitus.  Try to get plenty of rest each night. Contact a health care provider if:  You have tinnitus in just one ear.  Your tinnitus continues for 3 weeks or longer without stopping.  Home care measures are not helping.  You have tinnitus after a head injury.  You have tinnitus along with any of the following:  Dizziness.  Loss of balance.  Nausea and vomiting. This information is not intended to replace advice given to you by your health care provider. Make sure you discuss any questions you have with your health care provider. Document Released: 12/08/2005 Document Revised: 08/10/2016 Document Reviewed: 05/10/2014 Elsevier Interactive Patient Education  2017 [employer]

## 2016-12-23 NOTE — Progress Notes (Signed)
Subjective:  By signing my name below, I, Essence Howell, attest that this documentation has been prepared under the direction and in the presence of Norberto SorensonEva Jaymes Hang, MD Electronically Signed: Charline BillsEssence Howell, ED Scribe 12/23/2016 at 8:18 AM.   Patient ID: Jody Garcia, female    DOB: 1955-12-27, 61 y.o.   MRN: [id number]  Chief Complaint  Patient presents with  . Sinusitis  . Cough   HPI HPI Comments: Jody Droughteresa Blew is a 61 y.o. female who [facility] the [facility] complaining of sinus pressure for the past month, worsened over the past week. Pt states that sinus pressure is worsened with bending. She reports associated symptoms of postnasal drip, neck tightness, intermittent gum pain, intermittent night sweats, discolored rhinorrhea with streaks of blood and a swooshing sensation in her ears that tends to worsen when her sinuses flare-up. Pt has tried aspirin and Sudafed without significant relief. She denies light-headedness, visual disturbances, hearing loss. She reports a h/o a deviated septum.  Past Medical History:  Diagnosis Date  . Allergy   . Arthritis   . Atypical chest pain   . Diverticulosis   . Fibromyalgia   . Heart palpitations   . Hypertension   . Osteoporosis    No current outpatient prescriptions on file prior to visit.   No current facility-administered medications on file prior to visit.    Allergies  Allergen Reactions  . Corn-Containing Products   . Oxycodone Itching   Review of Systems  Constitutional: Positive for diaphoresis and fatigue. Negative for activity change, appetite change, chills and fever.  HENT: Positive for congestion, facial swelling (below medial aspect of left eye/upper nose), nosebleeds, postnasal drip, rhinorrhea, sinus pain, sinus pressure and tinnitus. Negative for dental problem, ear discharge, ear pain, hearing loss, mouth sores, sore throat and trouble swallowing.   Eyes: Negative for discharge and visual  disturbance.  Musculoskeletal: Positive for neck pain. Negative for gait problem, joint swelling and neck stiffness.  Neurological: Positive for headaches. Negative for dizziness and light-headedness.  Hematological: Positive for adenopathy.  Psychiatric/Behavioral: Positive for sleep disturbance.  BP 102/66 (BP Location: Right Arm, Patient Position: Sitting, Cuff Size: Small)   Pulse 71   Temp 98.3 F (36.8 C) (Oral)   Resp 18   Ht 5\' 6"  (1.676 m)   Wt 123 lb (55.8 kg)   SpO2 100%   BMI 19.85 kg/m     Objective:   Physical Exam  Constitutional: She is oriented to person, place, and time. She appears well-developed and well-nourished. No distress.  HENT:  Head: Normocephalic and atraumatic.  Right Ear: Tympanic membrane normal.  Left Ear: Tympanic membrane normal.  Nose: Nose normal.  Mouth/Throat: Oropharynx is clear and moist.  Eyes: Conjunctivae and EOM are normal.  Neck: Neck supple. Carotid bruit is not present. No tracheal deviation present.  Cardiovascular: Normal rate, regular rhythm, S1 normal, S2 normal and normal heart sounds.   Pulmonary/Chest: Effort normal and breath sounds normal. No respiratory distress.  Musculoskeletal: Normal range of motion.  Neurological: She is alert and oriented to person, place, and time.  Skin: Skin is warm and dry.  Psychiatric: She has a normal mood and affect. Her behavior is normal.  Nursing note and vitals reviewed.     Assessment & Plan:   1. Acute non-recurrent ethmoidal sinusitis - start nasal saline frequently throughout the day, nasal steroid. Could try sinus rinse if symptoms worsen. Due to length and severity of conditions as well as localized  infection appropriate to treat with Augmentin.   2. Tinnitus, unspecified laterality - Nonspecific and on history of increasing cyst "whooshing" noises in her ears occasionally at night. Thinks it's worse when she has sinus congestion. Try Flonase. If this is worsening may be worth an  ENT eval.      Meds ordered this encounter  Medications  . fluticasone (FLONASE) 50 MCG/ACT nasal spray    Sig: Place 2 sprays into both nostrils at bedtime.    Dispense:  16 g    Refill:  5  . amoxicillin-clavulanate (AUGMENTIN) 875-125 MG tablet    Sig: Take 1 tablet by mouth 2 (two) times daily.    Dispense:  14 tablet    Refill:  0    I personally performed the services described in this documentation, which was scribed in my presence. The recorded information has been reviewed and considered, and addended by me as needed.   Norberto Sorenson, M.D.  [facility] [address] [place], [place] [address] [phone] phone [phone] fax  12/23/16 8:40 AM

## 2017-12-01 ENCOUNTER — Other Ambulatory Visit: Payer: Self-pay | Admitting: Family Medicine

## 2017-12-01 ENCOUNTER — Ambulatory Visit
Admission: RE | Admit: 2017-12-01 | Discharge: 2017-12-01 | Disposition: A | Discharge status: Home / Self Care | Payer: No Typology Code available for payment source | Source: Ambulatory Visit | Attending: Family Medicine | Admitting: Family Medicine

## 2017-12-01 DIAGNOSIS — M546 Pain in thoracic spine: Secondary | ICD-10-CM

## 2017-12-01 DIAGNOSIS — M545 Low back pain: Secondary | ICD-10-CM

## 2020-01-25 ENCOUNTER — Other Ambulatory Visit: Payer: Self-pay | Admitting: Family Medicine

## 2020-01-25 DIAGNOSIS — M810 Age-related osteoporosis without current pathological fracture: Secondary | ICD-10-CM

## 2020-01-25 DIAGNOSIS — Z1231 Encounter for screening mammogram for malignant neoplasm of breast: Secondary | ICD-10-CM

## 2020-01-27 ENCOUNTER — Ambulatory Visit: Payer: No Typology Code available for payment source | Attending: Internal Medicine

## 2020-01-27 DIAGNOSIS — Z20822 Contact with and (suspected) exposure to covid-19: Secondary | ICD-10-CM | POA: Insufficient documentation

## 2020-01-28 LAB — NOVEL CORONAVIRUS, NAA: SARS-CoV-2, NAA: NOT DETECTED

## 2020-04-13 ENCOUNTER — Other Ambulatory Visit: Payer: No Typology Code available for payment source

## 2020-04-13 ENCOUNTER — Ambulatory Visit: Payer: No Typology Code available for payment source

## 2020-06-28 ENCOUNTER — Ambulatory Visit
Admission: RE | Admit: 2020-06-28 | Discharge: 2020-06-28 | Disposition: A | Discharge status: Home / Self Care | Payer: Medicare Other | Source: Ambulatory Visit | Attending: Family Medicine | Admitting: Family Medicine

## 2020-06-28 ENCOUNTER — Other Ambulatory Visit: Payer: Self-pay

## 2020-06-28 DIAGNOSIS — Z1231 Encounter for screening mammogram for malignant neoplasm of breast: Secondary | ICD-10-CM

## 2020-06-28 DIAGNOSIS — M810 Age-related osteoporosis without current pathological fracture: Secondary | ICD-10-CM

## 2020-07-06 ENCOUNTER — Other Ambulatory Visit: Payer: Self-pay | Admitting: Family Medicine

## 2020-07-06 ENCOUNTER — Ambulatory Visit
Admission: RE | Admit: 2020-07-06 | Discharge: 2020-07-06 | Disposition: A | Discharge status: Home / Self Care | Payer: Medicare Other | Source: Ambulatory Visit | Attending: Family Medicine | Admitting: Family Medicine

## 2020-07-06 DIAGNOSIS — R52 Pain, unspecified: Secondary | ICD-10-CM

## 2021-08-29 IMAGING — MG DIGITAL SCREENING BILAT W/ TOMO W/ CAD
8 series · 9 of 24 positions shown · non-contrast
Comparison: Previous exam(s).

CLINICAL DATA: Screening.

EXAM:
DIGITAL SCREENING BILATERAL MAMMOGRAM WITH TOMO AND CAD

[R CC synth-2D]
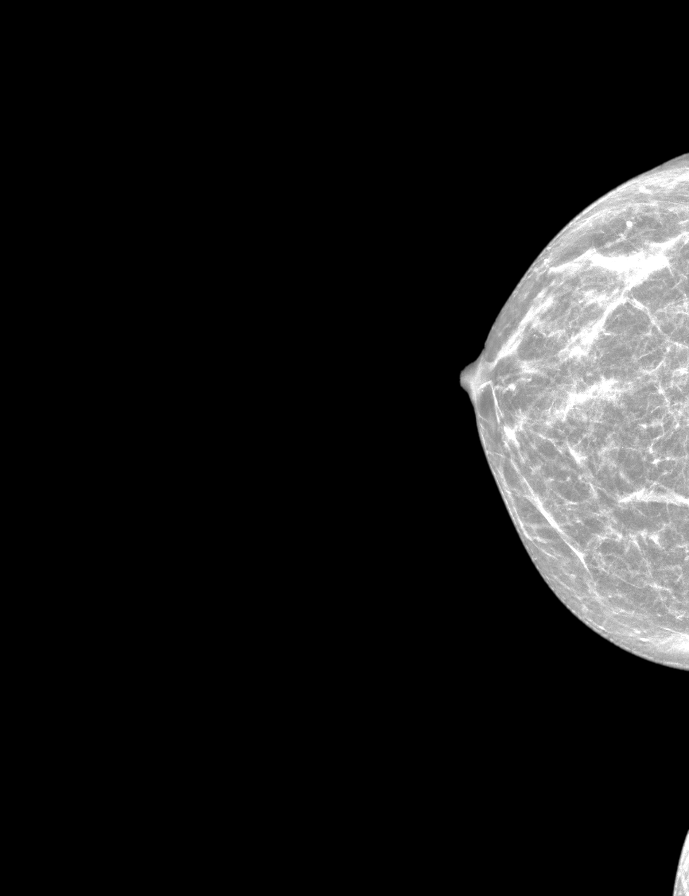

[L MLO synth-2D]
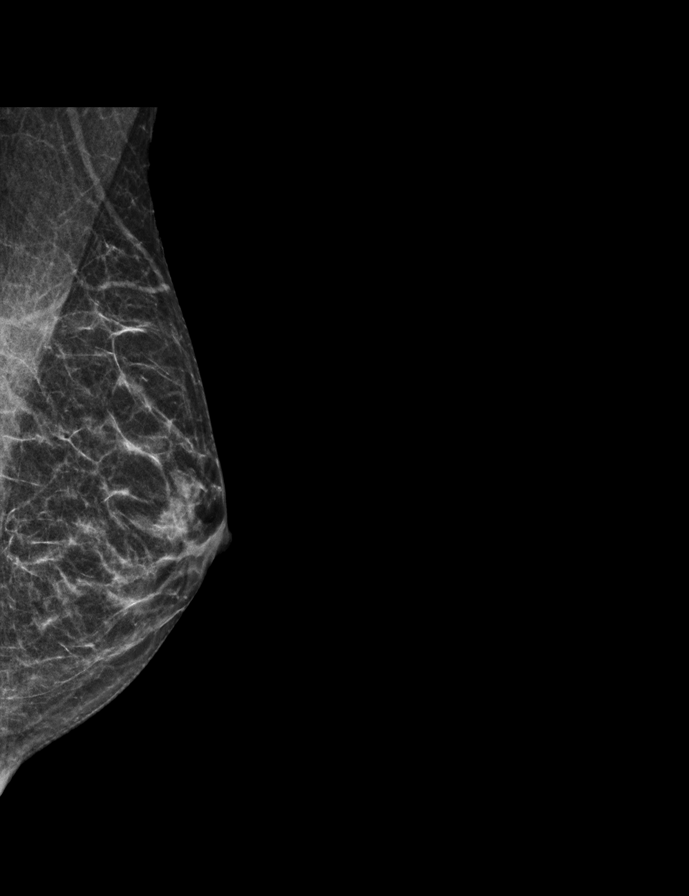

[R MLO synth-2D]
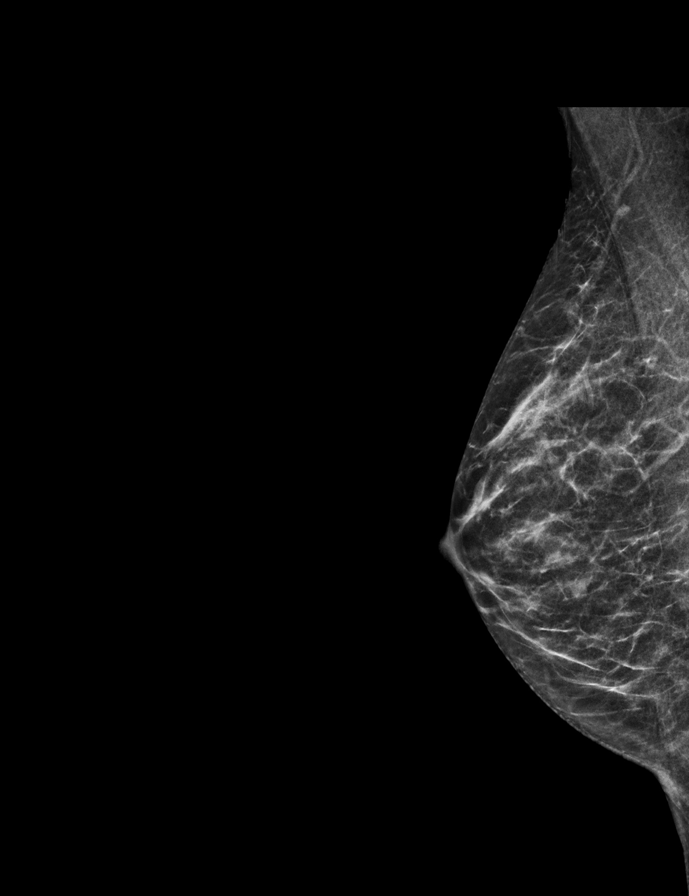

[L CC synth-2D]
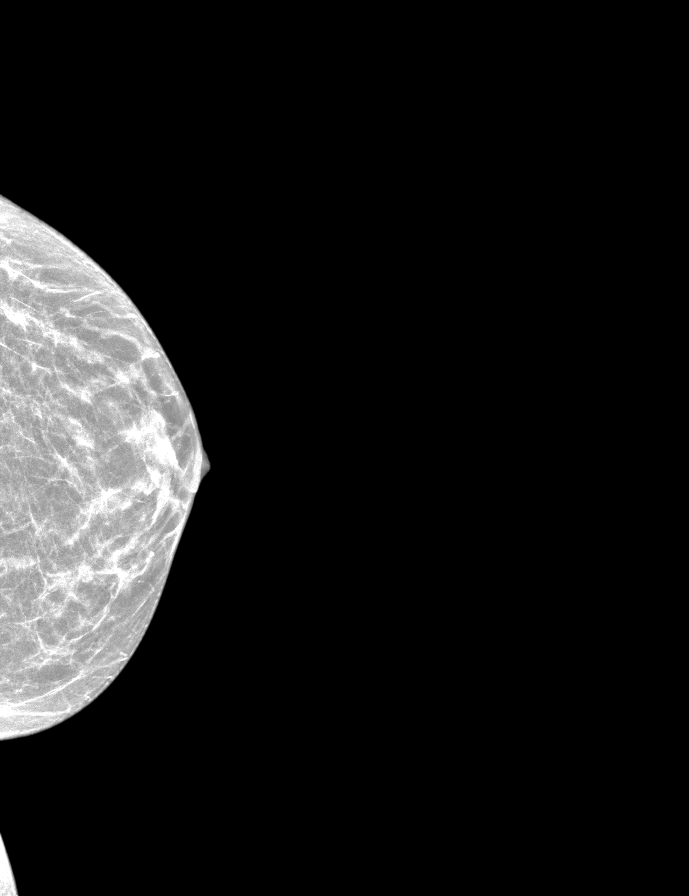

[R MLO tomo · 2 of 41 frames shown]
[frame 14/41]
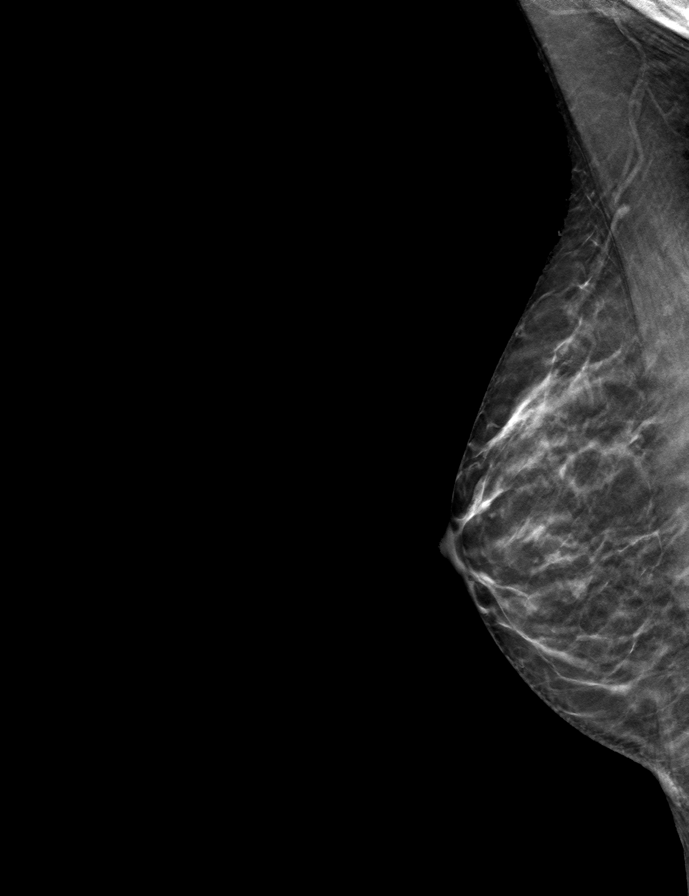
[frame 21/41]
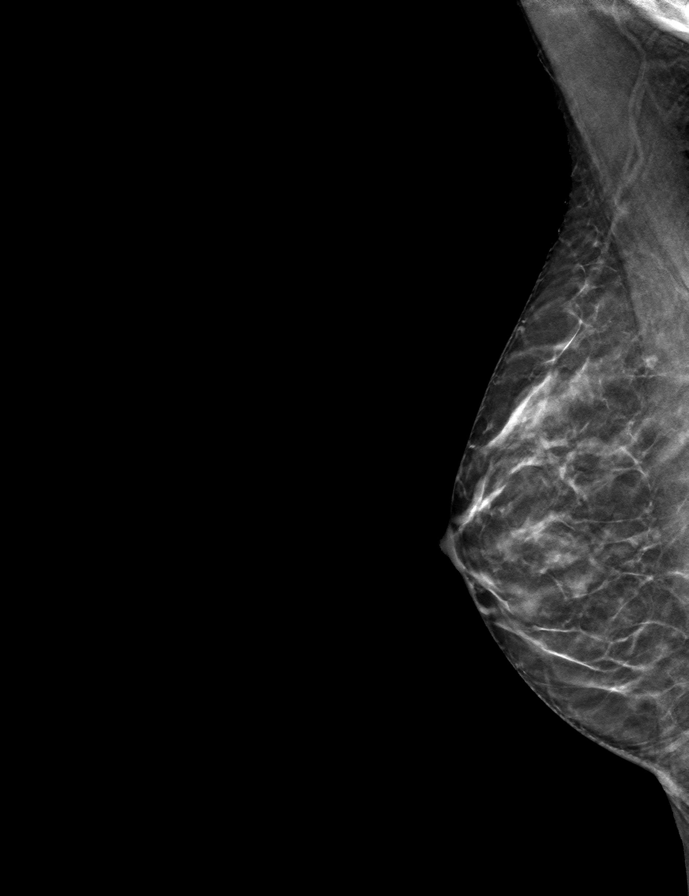

[L CC tomo · tomo slice 17/32.0]
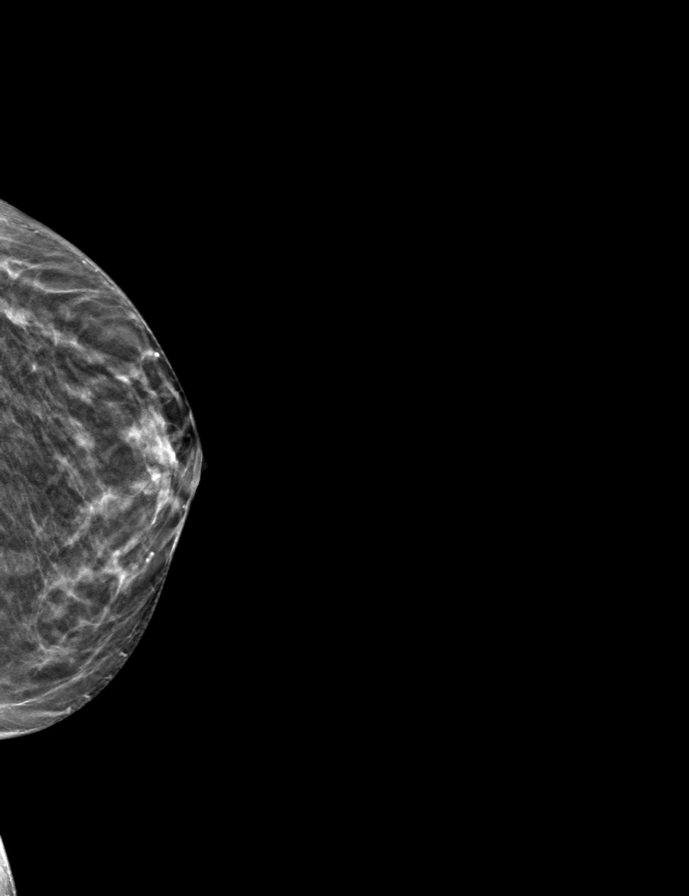

[R CC tomo · tomo slice 22/43.0]
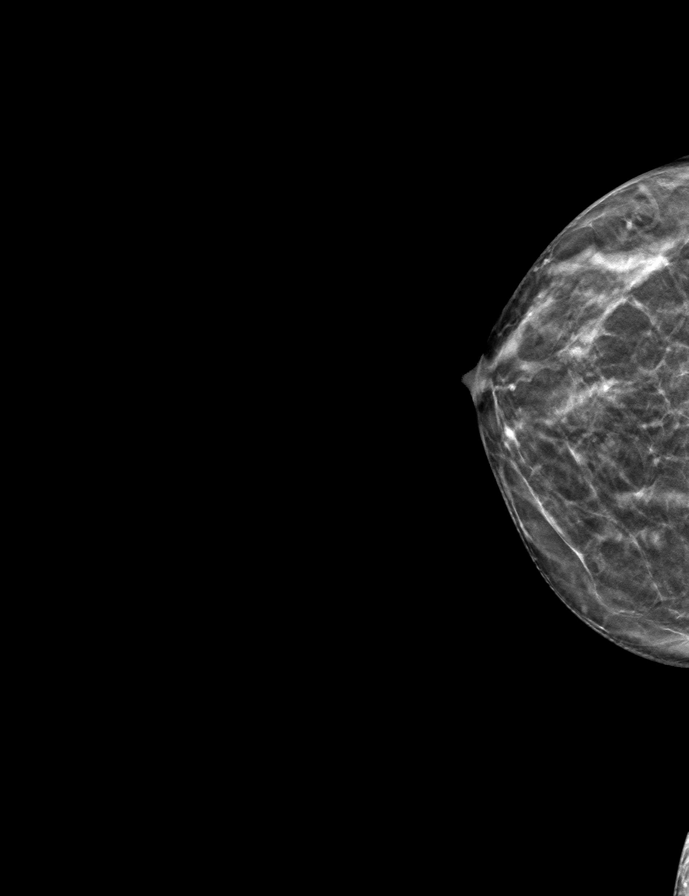

[L MLO tomo · tomo slice 21/42.0]
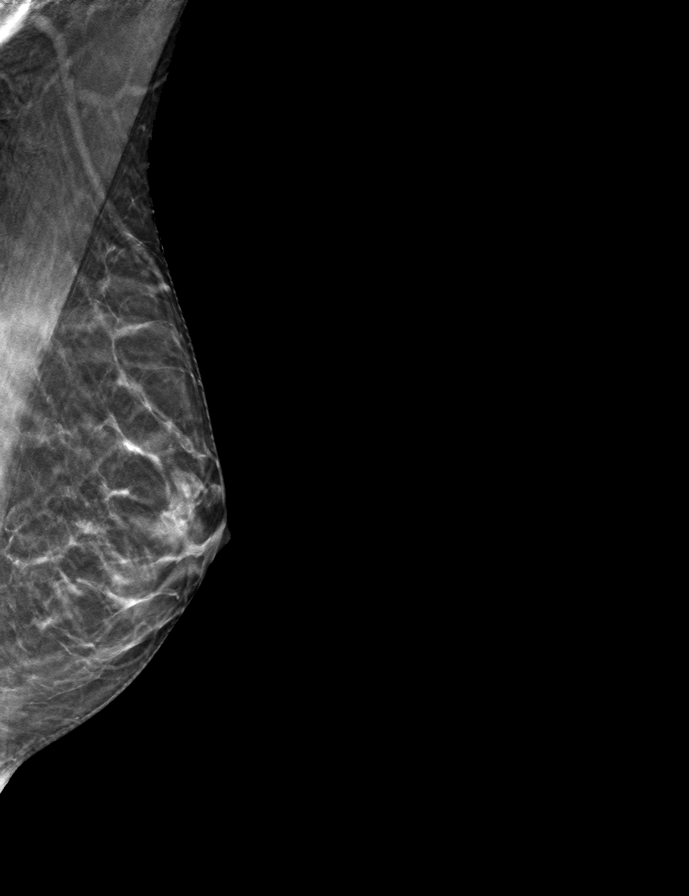

[9 of 24 positions shown; findings below may reference images not displayed]

ACR Breast Density Category b: There are scattered areas of
fibroglandular density.
FINDINGS: There are no findings suspicious for malignancy. Images were
processed with CAD.
IMPRESSION: No mammographic evidence of malignancy. A result letter of this
screening mammogram will be mailed directly to the patient.

RECOMMENDATION:
Screening mammogram in one year. (Code:[id number])

BI-RADS CATEGORY  1: Negative.

## 2021-09-06 IMAGING — CR DG THORACIC SPINE 3V
3 series · 3 of 3 positions shown · non-contrast
Comparison: 12/01/2017

CLINICAL DATA: Upper thoracic pain for 1 week after bowling.

EXAM:
THORACIC SPINE - 3 VIEWS

[t t-spine a.p.]
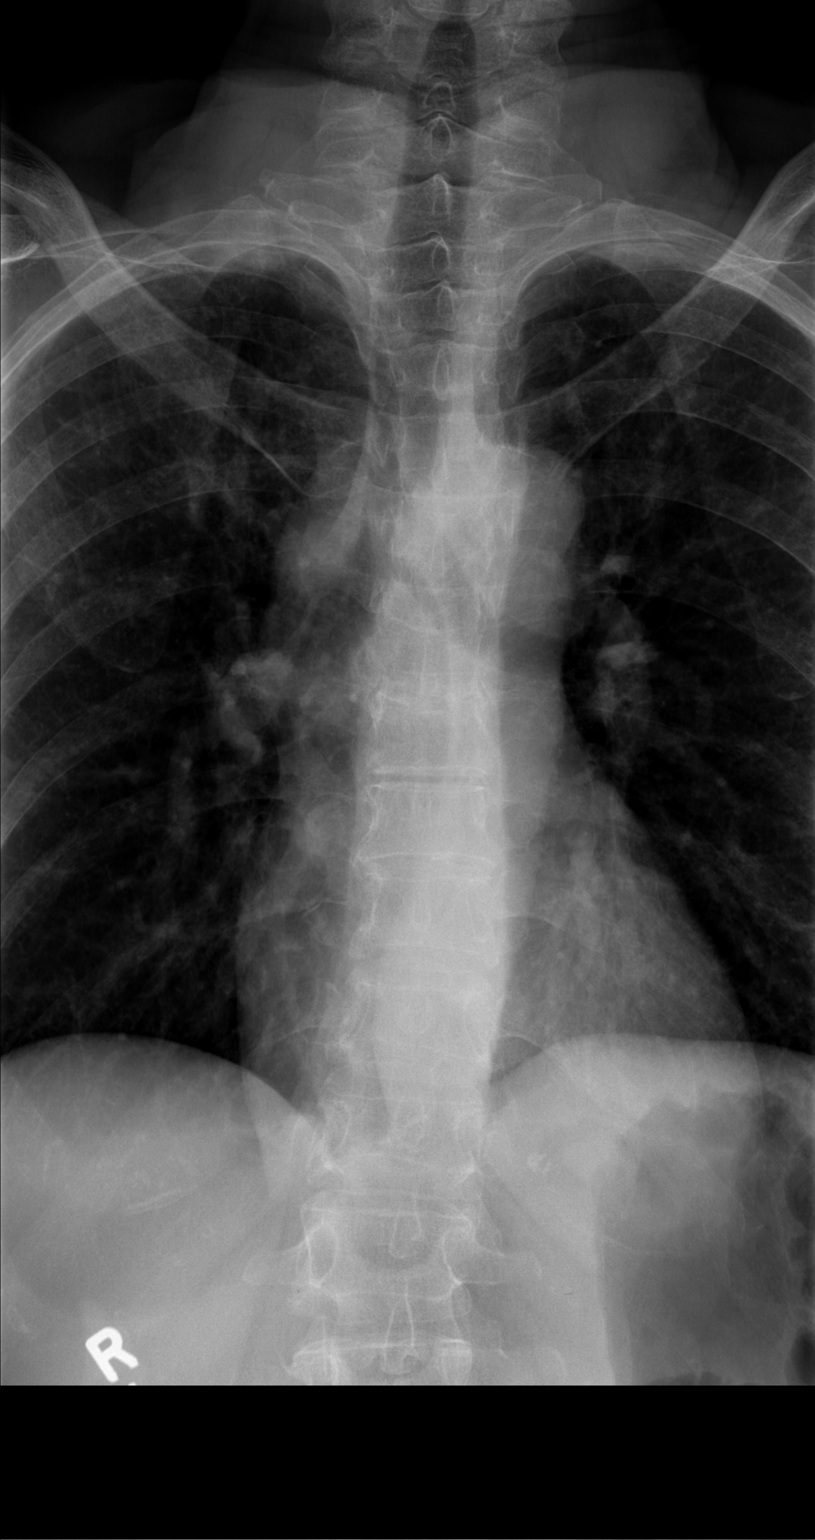

[t t-spine a.p. * (1 of 2)]
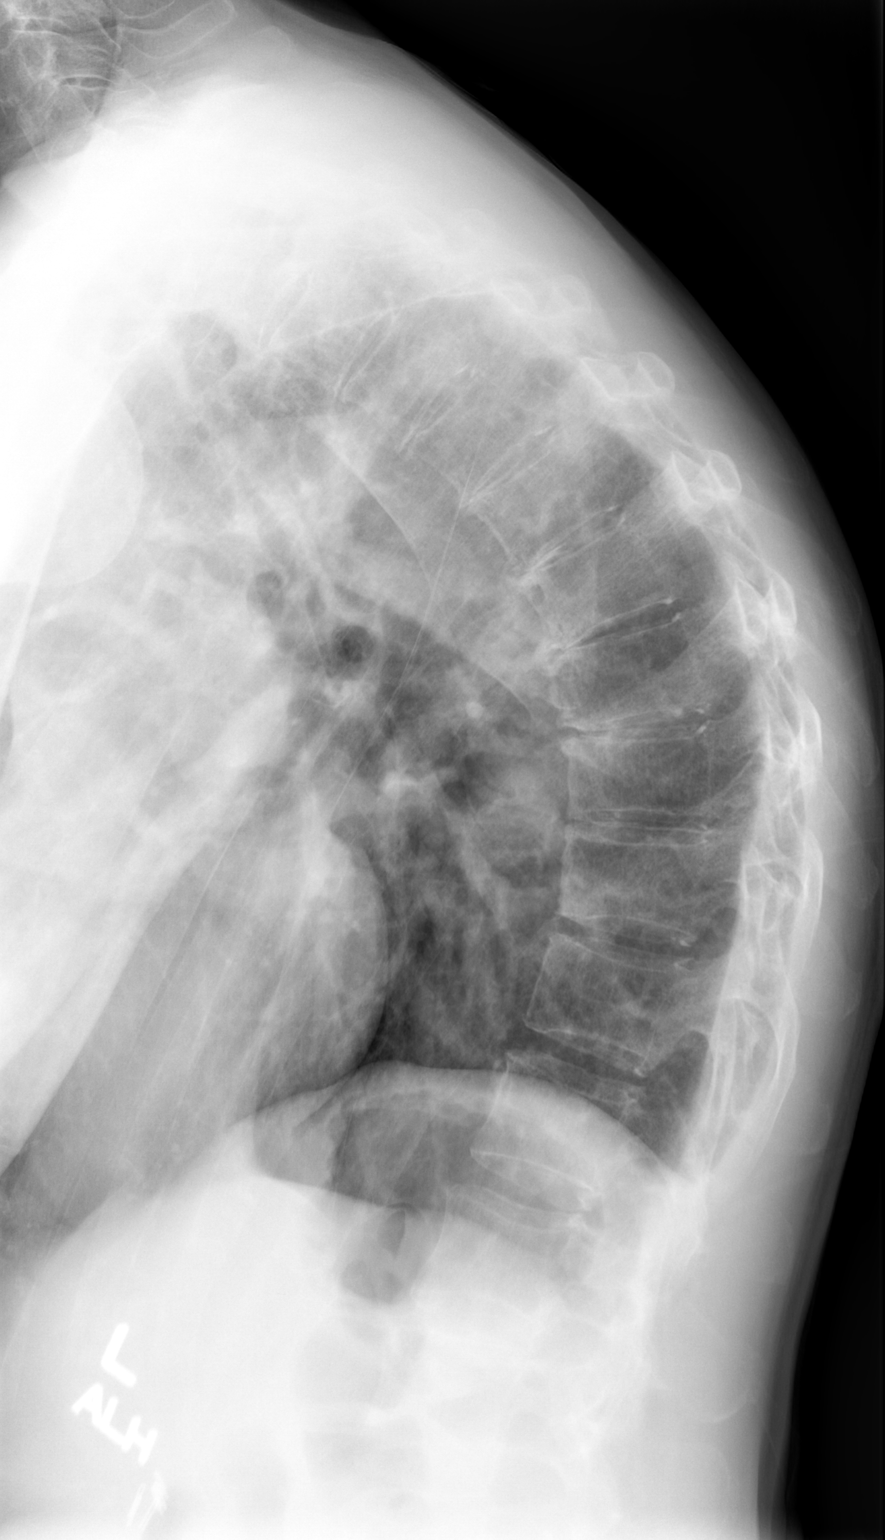

[t t-spine a.p. * (2 of 2)]
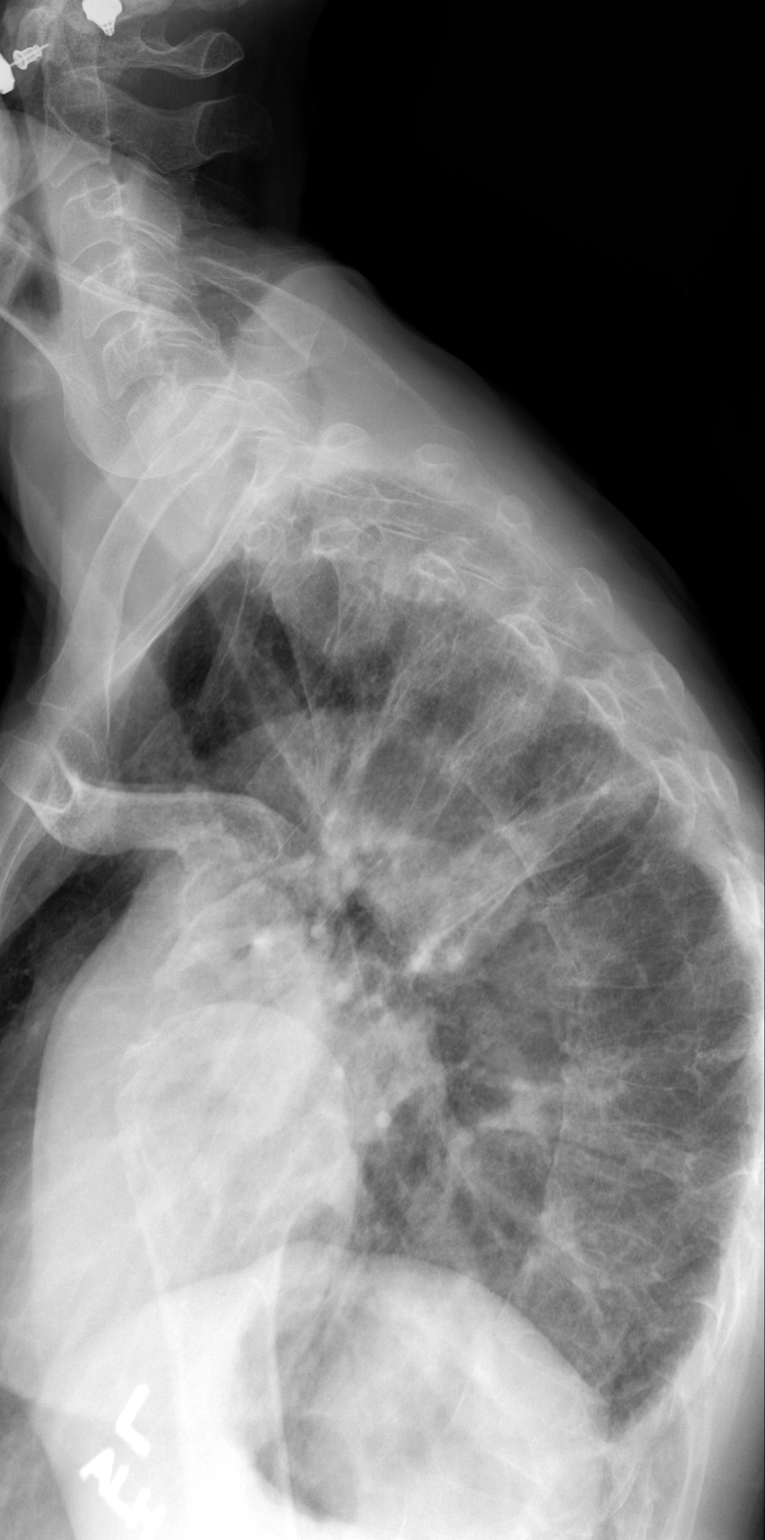

[3 of 3 positions shown; findings below may reference images not displayed]

FINDINGS: Mild upper lumbar dextroscoliosis and slight thoracic levoscoliosis
are again noted. There is exaggeration of the thoracic kyphosis
without evidence of significant listhesis. No fracture is
identified. Mild disc degeneration, most notable in the midthoracic
spine, similar to the prior study. Right upper abdominal surgical
clips are noted.
IMPRESSION: Stable chronic findings without an acute osseous abnormality
identified.

## 2022-03-21 DIAGNOSIS — M5136 Other intervertebral disc degeneration, lumbar region: Secondary | ICD-10-CM | POA: Diagnosis not present

## 2022-03-21 DIAGNOSIS — M810 Age-related osteoporosis without current pathological fracture: Secondary | ICD-10-CM | POA: Diagnosis not present

## 2022-03-21 DIAGNOSIS — R9389 Abnormal findings on diagnostic imaging of other specified body structures: Secondary | ICD-10-CM | POA: Diagnosis not present

## 2022-04-03 DIAGNOSIS — M4802 Spinal stenosis, cervical region: Secondary | ICD-10-CM | POA: Diagnosis not present

## 2022-04-03 DIAGNOSIS — M50221 Other cervical disc displacement at C4-C5 level: Secondary | ICD-10-CM | POA: Diagnosis not present

## 2022-04-03 DIAGNOSIS — M47814 Spondylosis without myelopathy or radiculopathy, thoracic region: Secondary | ICD-10-CM | POA: Diagnosis not present

## 2022-04-03 DIAGNOSIS — M797 Fibromyalgia: Secondary | ICD-10-CM | POA: Diagnosis not present

## 2022-04-03 DIAGNOSIS — M47812 Spondylosis without myelopathy or radiculopathy, cervical region: Secondary | ICD-10-CM | POA: Diagnosis not present

## 2022-08-19 DIAGNOSIS — H43813 Vitreous degeneration, bilateral: Secondary | ICD-10-CM | POA: Diagnosis not present

## 2022-11-05 DIAGNOSIS — H5203 Hypermetropia, bilateral: Secondary | ICD-10-CM | POA: Diagnosis not present

## 2023-01-15 DIAGNOSIS — H2512 Age-related nuclear cataract, left eye: Secondary | ICD-10-CM | POA: Diagnosis not present

## 2023-01-15 DIAGNOSIS — H268 Other specified cataract: Secondary | ICD-10-CM | POA: Diagnosis not present

## 2023-01-15 DIAGNOSIS — H269 Unspecified cataract: Secondary | ICD-10-CM | POA: Diagnosis not present

## 2023-02-03 DIAGNOSIS — M810 Age-related osteoporosis without current pathological fracture: Secondary | ICD-10-CM | POA: Diagnosis not present

## 2023-02-03 DIAGNOSIS — Z1159 Encounter for screening for other viral diseases: Secondary | ICD-10-CM | POA: Diagnosis not present

## 2023-02-03 DIAGNOSIS — E559 Vitamin D deficiency, unspecified: Secondary | ICD-10-CM | POA: Diagnosis not present

## 2023-02-03 DIAGNOSIS — Z124 Encounter for screening for malignant neoplasm of cervix: Secondary | ICD-10-CM | POA: Diagnosis not present

## 2023-02-03 DIAGNOSIS — R002 Palpitations: Secondary | ICD-10-CM | POA: Diagnosis not present

## 2023-02-03 DIAGNOSIS — R202 Paresthesia of skin: Secondary | ICD-10-CM | POA: Diagnosis not present

## 2023-02-03 DIAGNOSIS — Z0000 Encounter for general adult medical examination without abnormal findings: Secondary | ICD-10-CM | POA: Diagnosis not present

## 2023-02-03 DIAGNOSIS — Z136 Encounter for screening for cardiovascular disorders: Secondary | ICD-10-CM | POA: Diagnosis not present

## 2023-02-06 ENCOUNTER — Other Ambulatory Visit: Payer: Self-pay | Admitting: Family Medicine

## 2023-02-06 DIAGNOSIS — M818 Other osteoporosis without current pathological fracture: Secondary | ICD-10-CM

## 2023-02-06 DIAGNOSIS — Z1231 Encounter for screening mammogram for malignant neoplasm of breast: Secondary | ICD-10-CM

## 2023-02-12 DIAGNOSIS — H269 Unspecified cataract: Secondary | ICD-10-CM | POA: Diagnosis not present

## 2023-02-12 DIAGNOSIS — H2511 Age-related nuclear cataract, right eye: Secondary | ICD-10-CM | POA: Diagnosis not present

## 2023-02-25 ENCOUNTER — Ambulatory Visit: Payer: Medicare Other | Admitting: Cardiology

## 2023-02-25 ENCOUNTER — Encounter: Payer: Self-pay | Admitting: Cardiology

## 2023-02-25 ENCOUNTER — Other Ambulatory Visit: Payer: Medicare Other

## 2023-02-25 VITALS — BP 118/64 | HR 73 | Resp 16 | Ht 66.0 in | Wt 122.0 lb

## 2023-02-25 DIAGNOSIS — R072 Precordial pain: Secondary | ICD-10-CM | POA: Diagnosis not present

## 2023-02-25 DIAGNOSIS — R002 Palpitations: Secondary | ICD-10-CM

## 2023-02-25 DIAGNOSIS — I499 Cardiac arrhythmia, unspecified: Secondary | ICD-10-CM

## 2023-02-25 NOTE — Progress Notes (Signed)
Patient referred by [name], MD for palpitations  Subjective:   Jody Garcia, female    DOB: [birth year], 67 y.o.   MRN: [id number]   Chief Complaint  Patient presents with   Palpitations   New Patient (Initial Visit)     HPI  67 y.o. Caucasian female with fibromyalgia, chest pain, palpitations  Patient is here with her husband.  She is retired Radio producer, stays very busy with household activities, gardening etc.  She has a large family with 8 out of 13 grandchildren living in town.  Her 18 year old mother lives with her.  Patient does endorse stress related to caretaking of her mother, also some other family illnesses in the last 2-3 years.  She does not sleep well.  She drinks 2 cups of coffee every day.  She has noticed symptoms of palpitations, irregular heartbeat, occasional skipped beat, lasting for few seconds, occur every day.  A month ago, she had an episode of chest tightness at rest, resolved after few minutes.   Past Medical History:  Diagnosis Date   Allergy    Arthritis    Atypical chest pain    Diverticulosis    Fibromyalgia    Heart palpitations    Hypertension    Osteoporosis      Past Surgical History:  Procedure Laterality Date   CHOLECYSTECTOMY     WISDOM TOOTH EXTRACTION       Social History   Tobacco Use  Smoking Status Never  Smokeless Tobacco Never    Social History   Substance and Sexual Activity  Alcohol Use No     Family History  Problem Relation Age of Onset   Arthritis Mother    Hypertension Mother    Arthritis Father    Irregular heart beat Father    Allergies Father    Diverticulosis Father    Breast cancer Paternal Aunt    Diabetes Maternal Grandmother    Breast cancer Paternal Aunt       Current Outpatient Medications:    Cholecalciferol 25 MCG (1000 UT) capsule, Take 1,000 Units by mouth daily., Disp: , Rfl:    fluticasone (FLONASE) 50 MCG/ACT nasal spray, Place 2 sprays into both  nostrils at bedtime., Disp: 16 g, Rfl: 5   Cardiovascular and other pertinent studies:  Reviewed external labs and tests, independently interpreted  EKG 02/25/2023: Sinus rhythm 75 bpm Nonspecific ST depression -Nondiagnostic   Recent labs: 02/05/2023: Glucose 82, BUN/Cr 13/0.66. EGFR 96. Na/K 141/3.6. Rest of the CMP normal H/H 12/39. MCV 88. Platelets 244 HbA1C NA Chol 223, TG 66, HDL 90, LDL 121 TSH 1.3 normal   Review of Systems  Cardiovascular:  Positive for chest pain and palpitations. Negative for dyspnea on exertion, leg swelling and syncope.        Vitals:   02/25/23 1316  BP: 118/64  Pulse: 73  Resp: 16  SpO2: 97%     Body mass index is 19.69 kg/m. Filed Weights   02/25/23 1316  Weight: 122 lb (55.3 kg)    Objective:   Physical Exam Vitals and nursing note reviewed.  Constitutional:      General: She is not in acute distress. Neck:     Vascular: No JVD.  Cardiovascular:     Rate and Rhythm: Normal rate and regular rhythm.     Heart sounds: Normal heart sounds. No murmur heard. Pulmonary:     Effort: Pulmonary effort is normal.     Breath sounds: Normal breath sounds. No  wheezing or rales.  Musculoskeletal:     Right lower leg: No edema.     Left lower leg: No edema.          Visit diagnoses:   ICD-10-CM   1. Palpitations  R00.2 EKG 12-Lead    LONG TERM MONITOR (3-14 DAYS)    2. Irregular heart beat  I49.9 LONG TERM MONITOR (3-14 DAYS)    3. Precordial pain  R07.2 PCV CARDIAC STRESS TEST    PCV ECHOCARDIOGRAM COMPLETE    CT CARDIAC SCORING (SELF PAY ONLY)       Orders Placed This Encounter  Procedures   CT CARDIAC SCORING (SELF PAY ONLY)   PCV CARDIAC STRESS TEST   LONG TERM MONITOR (3-14 DAYS)   EKG 12-Lead   PCV ECHOCARDIOGRAM COMPLETE      Assessment & Recommendations:    67 y.o. Caucasian female with fibromyalgia, chest pain, palpitations  Palpitations: Most likely occasional PAC/PVC. Recommended recent caffeine  intake. Discussed sleep hygiene, ways to reduce stress.  Precordial pain: Low suspicion for angina.  Recommend exercise treadmill stress test, echocardiogram, and CT cardiac scoring.   Thank you for referring the patient to [place]. Please feel free to contact with any questions.   [name], MD Pager: [phone] Office: [phone]

## 2023-03-03 ENCOUNTER — Other Ambulatory Visit: Payer: Medicare Other

## 2023-03-19 ENCOUNTER — Other Ambulatory Visit: Payer: Medicare Other

## 2023-03-19 ENCOUNTER — Inpatient Hospital Stay: Admission: RE | Admit: 2023-03-19 | Payer: Medicare Other | Source: Ambulatory Visit

## 2023-03-23 ENCOUNTER — Other Ambulatory Visit: Payer: Self-pay | Admitting: Cardiology

## 2023-03-23 ENCOUNTER — Telehealth: Payer: Self-pay | Admitting: Cardiology

## 2023-03-23 ENCOUNTER — Ambulatory Visit: Payer: Medicare Other

## 2023-03-23 DIAGNOSIS — R9439 Abnormal result of other cardiovascular function study: Secondary | ICD-10-CM

## 2023-03-23 DIAGNOSIS — R9431 Abnormal electrocardiogram [ECG] [EKG]: Secondary | ICD-10-CM

## 2023-03-23 DIAGNOSIS — R072 Precordial pain: Secondary | ICD-10-CM

## 2023-03-23 MED ORDER — ASPIRIN 81 MG PO TBEC: 81.0000 mg | DELAYED_RELEASE_TABLET | Freq: Every day | ORAL | 3 refills | Status: DC

## 2023-03-23 MED ORDER — METOPROLOL TARTRATE 25 MG PO TABS
25.0000 mg | ORAL_TABLET | Freq: Two times a day (BID) | ORAL | 1 refills | Status: DC
Start: 2023-03-23 — End: 2023-04-15

## 2023-03-23 MED ORDER — ROSUVASTATIN CALCIUM 10 MG PO TABS: 10.0000 mg | ORAL_TABLET | Freq: Every day | ORAL | 1 refills | Status: DC

## 2023-03-23 NOTE — Telephone Encounter (Signed)
Discussed abnormal EKG treadmill stress test findings with the patient.  Recommend coronary CT angiogram for further evaluation.  In the meantime, recommend aspirin 81 mg, Crestor 10 mg, metoprolol tartrate 25 mg twice daily.  All medications were flagged as allergy/contraindication given "corn containing products".  On specific questioning, this was flagged as an intolerance with patient's underlying fibromyalgia in the remote past.  There was no reported anaphylactic reaction.  It is reasonable to proceed with taking these medications.  Further recommendations after above testing.   [name], MD Pager: [phone] Office: [phone]

## 2023-03-24 DIAGNOSIS — R002 Palpitations: Secondary | ICD-10-CM | POA: Diagnosis not present

## 2023-03-31 ENCOUNTER — Other Ambulatory Visit: Payer: Self-pay | Admitting: Cardiology

## 2023-03-31 DIAGNOSIS — R072 Precordial pain: Secondary | ICD-10-CM

## 2023-04-01 ENCOUNTER — Ambulatory Visit: Payer: Medicare Other

## 2023-04-01 DIAGNOSIS — R072 Precordial pain: Secondary | ICD-10-CM

## 2023-04-02 LAB — BASIC METABOLIC PANEL
BUN/Creatinine Ratio: 21 (ref 12–28)
BUN: 12 mg/dL (ref 8–27)
CO2: 23 mmol/L (ref 20–29)
Calcium: 9.6 mg/dL (ref 8.7–10.3)
Chloride: 103 mmol/L (ref 96–106)
Creatinine, Ser: 0.58 mg/dL (ref 0.57–1.00)
Glucose: 88 mg/dL (ref 70–99)
Potassium: 4.5 mmol/L (ref 3.5–5.2)
Sodium: 141 mmol/L (ref 134–144)
eGFR: 99 mL/min/{1.73_m2} (ref >59)

## 2023-04-07 ENCOUNTER — Telehealth (HOSPITAL_COMMUNITY): Payer: Self-pay | Admitting: *Deleted

## 2023-04-07 NOTE — Telephone Encounter (Signed)
Reaching out to patient to offer assistance regarding upcoming cardiac imaging study; pt verbalizes understanding of appt date/time, parking situation and where to check in, pre-test NPO status and medications ordered, and verified current allergies; name and call back number provided for further questions should they arise ? ?Stassi Fadely RN Navigator Cardiac Imaging ?[facility] ?[phone] office ?[phone] cell ? ?Patient to take 50mg metoprolol tartrate two hours prior to her cardiac CT scan. She is aware to arrive at 8:30am.  ?

## 2023-04-07 NOTE — Telephone Encounter (Signed)
Attempted to call patient regarding upcoming cardiac CT appointment. °Left message on voicemail with name and callback number ° °Damin Salido RN Navigator Cardiac Imaging °[facility] °[phone] Office °[phone] Cell ° °

## 2023-04-08 ENCOUNTER — Other Ambulatory Visit: Payer: Medicare Other

## 2023-04-08 ENCOUNTER — Ambulatory Visit (HOSPITAL_COMMUNITY)
Admission: RE | Admit: 2023-04-08 | Discharge: 2023-04-08 | Disposition: A | Discharge status: Home / Self Care | Payer: Medicare Other | Source: Ambulatory Visit | Attending: Family Medicine | Admitting: Family Medicine

## 2023-04-08 DIAGNOSIS — R9439 Abnormal result of other cardiovascular function study: Secondary | ICD-10-CM | POA: Diagnosis not present

## 2023-04-08 DIAGNOSIS — R072 Precordial pain: Secondary | ICD-10-CM | POA: Diagnosis not present

## 2023-04-08 DIAGNOSIS — R9431 Abnormal electrocardiogram [ECG] [EKG]: Secondary | ICD-10-CM

## 2023-04-08 MED ORDER — IOHEXOL 350 MG/ML SOLN
100.0000 mL | Freq: Once | INTRAVENOUS | Status: AC | PRN
  Administered 2023-04-08: 100 mL via INTRAVENOUS

## 2023-04-08 MED ORDER — NITROGLYCERIN 0.4 MG SL SUBL
SUBLINGUAL_TABLET | SUBLINGUAL | Status: AC
  Filled 2023-04-08: qty 2

## 2023-04-08 MED ORDER — NITROGLYCERIN 0.4 MG SL SUBL
0.8000 mg | SUBLINGUAL_TABLET | Freq: Once | SUBLINGUAL | Status: AC
  Administered 2023-04-08: 0.8 mg via SUBLINGUAL

## 2023-04-09 ENCOUNTER — Telehealth: Payer: Self-pay

## 2023-04-09 NOTE — Telephone Encounter (Signed)
Patient called and stated that she is still having palpitations, and the Metoprolol helps, but still has the palpitations.  Patient would like a phone call just to clarify some things with you. Thank you.

## 2023-04-09 NOTE — Progress Notes (Signed)
Please schedule echo in 1 year for moderate MR.  Thanks MJP

## 2023-04-15 ENCOUNTER — Other Ambulatory Visit: Payer: Self-pay | Admitting: Cardiology

## 2023-04-15 DIAGNOSIS — R9431 Abnormal electrocardiogram [ECG] [EKG]: Secondary | ICD-10-CM

## 2023-04-15 DIAGNOSIS — R9439 Abnormal result of other cardiovascular function study: Secondary | ICD-10-CM

## 2023-04-15 DIAGNOSIS — R072 Precordial pain: Secondary | ICD-10-CM

## 2023-04-22 DIAGNOSIS — M810 Age-related osteoporosis without current pathological fracture: Secondary | ICD-10-CM | POA: Diagnosis not present

## 2023-04-22 DIAGNOSIS — J3489 Other specified disorders of nose and nasal sinuses: Secondary | ICD-10-CM | POA: Diagnosis not present

## 2023-04-22 DIAGNOSIS — R002 Palpitations: Secondary | ICD-10-CM | POA: Diagnosis not present

## 2023-04-27 NOTE — Telephone Encounter (Signed)
I did talk to her a few weeks ago. I am not sure if this message was before or after that. Happy to call back if patient has any more pending questions.  Thanks MJP

## 2023-05-05 DIAGNOSIS — M810 Age-related osteoporosis without current pathological fracture: Secondary | ICD-10-CM | POA: Diagnosis not present

## 2023-05-05 DIAGNOSIS — E559 Vitamin D deficiency, unspecified: Secondary | ICD-10-CM | POA: Diagnosis not present

## 2023-05-05 DIAGNOSIS — J3489 Other specified disorders of nose and nasal sinuses: Secondary | ICD-10-CM | POA: Diagnosis not present

## 2023-05-06 DIAGNOSIS — J3489 Other specified disorders of nose and nasal sinuses: Secondary | ICD-10-CM | POA: Diagnosis not present

## 2023-06-01 DIAGNOSIS — Q309 Congenital malformation of nose, unspecified: Secondary | ICD-10-CM | POA: Diagnosis not present

## 2023-06-03 ENCOUNTER — Ambulatory Visit: Payer: Medicare Other | Admitting: Cardiology

## 2023-06-04 DIAGNOSIS — K080 Exfoliation of teeth due to systemic causes: Secondary | ICD-10-CM | POA: Diagnosis not present

## 2023-06-08 ENCOUNTER — Encounter: Payer: Self-pay | Admitting: Cardiology

## 2023-06-08 ENCOUNTER — Ambulatory Visit: Payer: Medicare Other | Admitting: Cardiology

## 2023-06-08 DIAGNOSIS — R9431 Abnormal electrocardiogram [ECG] [EKG]: Secondary | ICD-10-CM

## 2023-06-08 DIAGNOSIS — R072 Precordial pain: Secondary | ICD-10-CM

## 2023-06-08 DIAGNOSIS — R9439 Abnormal result of other cardiovascular function study: Secondary | ICD-10-CM | POA: Diagnosis not present

## 2023-06-08 MED ORDER — METOPROLOL TARTRATE 25 MG PO TABS: 25.0000 mg | ORAL_TABLET | ORAL | 0 refills | Status: DC | PRN

## 2023-06-08 NOTE — Progress Notes (Signed)
Patient referred by Maurice Small, MD for palpitations  Subjective:   Jody Garcia, female    DOB: Jan 20, 1956, 67 y.o.   MRN: [id number]   Chief Complaint  Patient presents with   Palpitations   Follow-up     HPI  67 y.o. Caucasian female with fibromyalgia, chest pain, palpitations  Patient is doing well. She is not taking metoprolol regularly, but has only had occasional palpitations.  Initial consultation visit 02/2023: Patient is here with her husband.  She is retired Chartered loss adjuster, stays very busy with household activities, gardening etc.  She has a large family with 8 out of 13 grandchildren living in town.  Her 65 year old mother lives with her.  Patient does endorse stress related to caretaking of her mother, also some other family illnesses in the last 2-3 years.  She does not sleep well.  She drinks 2 cups of coffee every day.  She has noticed symptoms of palpitations, irregular heartbeat, occasional skipped beat, lasting for few seconds, occur every day.  A month ago, she had an episode of chest tightness at rest, resolved after few minutes.  Current Outpatient Medications:    Cholecalciferol 25 MCG (1000 UT) capsule, Take 1,000 Units by mouth daily., Disp: , Rfl:    fluticasone (FLONASE) 50 MCG/ACT nasal spray, Place 2 sprays into both nostrils at bedtime., Disp: 16 g, Rfl: 5   metoprolol tartrate (LOPRESSOR) 25 MG tablet, TAKE 1 TABLET BY MOUTH TWICE A DAY, Disp: 180 tablet, Rfl: 1   Cardiovascular and other pertinent studies:  Reviewed external labs and tests, independently interpreted  EKG 02/25/2023: Sinus rhythm 75 bpm Nonspecific ST depression -Nondiagnostic  Coronary CTA 04/08/2023: 1. Total coronary calcium score of 0. 2. Normal coronary origin with left dominance. 3. 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   Left Main: Patent. LAD: Minimal soft plaque (0-24% stenosis) within the proximal segment, mid to apical segments are patent. LCx:  Patent. RCA: Patent.  Echocardiogram 04/01/2023: Normal LV systolic function with visual EF 60-65%. Left ventricle cavity is normal in size. Normal left ventricular wall thickness. Normal global wall motion. Normal diastolic filling pattern, normal LAP. Calculated EF 60%. Left atrial cavity is slightly dilated. Structurally normal mitral valve.  Moderate (Grade II) mitral regurgitation. Structurally normal tricuspid valve.  Mild tricuspid regurgitation. No evidence of pulmonary hypertension. No prior available for comparison.  Mobile cardiac telemetry 8 days 02/25/2023 - 03/05/2023: Dominant rhythm: Sinus. HR 50-121 bpm. Avg HR 74 bpm. 15 episodes of atrial tachycardia/ SVT, fastest at 226 bpm for 8 beats, longest for 9 beats at 135 bpm. <1% isolated SVE, couplet/triplets. 0 episodes of VT. <1% isolated VE, couplets. No atrial fibrillation/atrial flutter/VT/high grade AV block, sinus pause >3sec noted. 0 patient triggered events.      Recent labs: 04/01/2023: Glucose 88, BUN/Cr 12/0.58. EGFR 99. Na/K 141/4.5.   02/05/2023: Glucose 82, BUN/Cr 13/0.66. EGFR 96. Na/K 141/3.6. Rest of the CMP normal H/H 12/39. MCV 88. Platelets 244 HbA1C NA Chol 223, TG 66, HDL 90, LDL 121 TSH 1.3 normal   Review of Systems  Cardiovascular:  Positive for palpitations (Only occasional). Negative for chest pain, dyspnea on exertion, leg swelling and syncope.        Vitals:   06/08/23 1105  BP: 131/69  Pulse: 85  SpO2: 100%     Body mass index is 19.85 kg/m. Filed Weights   06/08/23 1105  Weight: 123 lb (55.8 kg)    Objective:   Physical Exam Vitals and nursing  note reviewed.  Constitutional:      General: She is not in acute distress. Neck:     Vascular: No JVD.  Cardiovascular:     Rate and Rhythm: Normal rate and regular rhythm.     Heart sounds: Normal heart sounds. No murmur heard. Pulmonary:     Effort: Pulmonary effort is normal.     Breath sounds: Normal breath  sounds. No wheezing or rales.  Musculoskeletal:     Right lower leg: No edema.     Left lower leg: No edema.          Visit diagnoses:   ICD-10-CM   1. Precordial pain  R07.2 metoprolol tartrate (LOPRESSOR) 25 MG tablet    2. Abnormal electrocardiogram  R94.31 metoprolol tartrate (LOPRESSOR) 25 MG tablet    3. Abnormal stress ECG with treadmill  R94.39 metoprolol tartrate (LOPRESSOR) 25 MG tablet       Assessment & Recommendations:    67 y.o. Caucasian female with fibromyalgia, chest pain, palpitations  Palpitations: Most likely occasional PAC/PVC. Recommended recent caffeine intake. Discussed sleep hygiene, ways to reduce stress. Can take metoprolol only prn.  Precordial pain: Minimal nonobstructive CAD on CTA (03/2023). Calcium score 0. Continue heart healthy diet and lifestyle, Ok to not be on statin.  F/u in 1 year     Elder Negus, MD Pager: [phone] Office: [phone]

## 2023-06-23 DIAGNOSIS — H0014 Chalazion left upper eyelid: Secondary | ICD-10-CM | POA: Diagnosis not present

## 2023-07-15 DIAGNOSIS — M25572 Pain in left ankle and joints of left foot: Secondary | ICD-10-CM | POA: Diagnosis not present

## 2023-07-15 DIAGNOSIS — M810 Age-related osteoporosis without current pathological fracture: Secondary | ICD-10-CM | POA: Diagnosis not present

## 2023-09-02 ENCOUNTER — Other Ambulatory Visit: Payer: Self-pay

## 2023-09-02 DIAGNOSIS — R072 Precordial pain: Secondary | ICD-10-CM

## 2023-09-15 DIAGNOSIS — H26493 Other secondary cataract, bilateral: Secondary | ICD-10-CM | POA: Diagnosis not present

## 2023-09-15 DIAGNOSIS — H18513 Endothelial corneal dystrophy, bilateral: Secondary | ICD-10-CM | POA: Diagnosis not present

## 2023-09-23 DIAGNOSIS — H26491 Other secondary cataract, right eye: Secondary | ICD-10-CM | POA: Diagnosis not present

## 2023-10-01 ENCOUNTER — Ambulatory Visit
Admission: RE | Admit: 2023-10-01 | Discharge: 2023-10-01 | Disposition: A | Discharge status: Home / Self Care | Payer: Medicare Other | Source: Ambulatory Visit | Attending: Family Medicine | Admitting: Family Medicine

## 2023-10-01 ENCOUNTER — Ambulatory Visit: Payer: Medicare Other

## 2023-10-01 DIAGNOSIS — M818 Other osteoporosis without current pathological fracture: Secondary | ICD-10-CM

## 2023-10-01 DIAGNOSIS — Z1231 Encounter for screening mammogram for malignant neoplasm of breast: Secondary | ICD-10-CM

## 2023-10-01 DIAGNOSIS — Z8262 Family history of osteoporosis: Secondary | ICD-10-CM | POA: Diagnosis not present

## 2023-10-01 DIAGNOSIS — M8588 Other specified disorders of bone density and structure, other site: Secondary | ICD-10-CM | POA: Diagnosis not present

## 2023-10-01 DIAGNOSIS — E349 Endocrine disorder, unspecified: Secondary | ICD-10-CM | POA: Diagnosis not present

## 2023-10-14 DIAGNOSIS — H26492 Other secondary cataract, left eye: Secondary | ICD-10-CM | POA: Diagnosis not present

## 2023-10-20 DIAGNOSIS — M5416 Radiculopathy, lumbar region: Secondary | ICD-10-CM | POA: Diagnosis not present

## 2023-10-20 DIAGNOSIS — G9520 Unspecified cord compression: Secondary | ICD-10-CM | POA: Diagnosis not present

## 2023-10-20 DIAGNOSIS — M810 Age-related osteoporosis without current pathological fracture: Secondary | ICD-10-CM | POA: Diagnosis not present

## 2023-10-20 DIAGNOSIS — M4802 Spinal stenosis, cervical region: Secondary | ICD-10-CM | POA: Diagnosis not present

## 2023-12-02 DIAGNOSIS — Q309 Congenital malformation of nose, unspecified: Secondary | ICD-10-CM | POA: Diagnosis not present

## 2023-12-30 DIAGNOSIS — M4004 Postural kyphosis, thoracic region: Secondary | ICD-10-CM | POA: Diagnosis not present

## 2024-01-05 DIAGNOSIS — K080 Exfoliation of teeth due to systemic causes: Secondary | ICD-10-CM | POA: Diagnosis not present

## 2024-02-26 DIAGNOSIS — Z0000 Encounter for general adult medical examination without abnormal findings: Secondary | ICD-10-CM | POA: Diagnosis not present

## 2024-02-26 DIAGNOSIS — Z131 Encounter for screening for diabetes mellitus: Secondary | ICD-10-CM | POA: Diagnosis not present

## 2024-02-26 DIAGNOSIS — E7800 Pure hypercholesterolemia, unspecified: Secondary | ICD-10-CM | POA: Diagnosis not present

## 2024-04-04 ENCOUNTER — Other Ambulatory Visit: Payer: Medicare Other

## 2024-04-04 ENCOUNTER — Ambulatory Visit (HOSPITAL_COMMUNITY): Payer: Medicare Other

## 2024-04-20 DIAGNOSIS — H5213 Myopia, bilateral: Secondary | ICD-10-CM | POA: Diagnosis not present

## 2024-06-08 ENCOUNTER — Ambulatory Visit: Payer: Self-pay | Admitting: Cardiology

## 2024-07-27 DIAGNOSIS — K080 Exfoliation of teeth due to systemic causes: Secondary | ICD-10-CM | POA: Diagnosis not present

## 2024-08-19 DIAGNOSIS — H43813 Vitreous degeneration, bilateral: Secondary | ICD-10-CM | POA: Diagnosis not present

## 2024-08-19 DIAGNOSIS — G43909 Migraine, unspecified, not intractable, without status migrainosus: Secondary | ICD-10-CM | POA: Diagnosis not present

## 2024-09-02 DIAGNOSIS — R002 Palpitations: Secondary | ICD-10-CM | POA: Diagnosis not present

## 2024-09-02 DIAGNOSIS — E782 Mixed hyperlipidemia: Secondary | ICD-10-CM | POA: Diagnosis not present

## 2024-09-02 DIAGNOSIS — M797 Fibromyalgia: Secondary | ICD-10-CM | POA: Diagnosis not present

## 2024-09-02 DIAGNOSIS — R52 Pain, unspecified: Secondary | ICD-10-CM | POA: Diagnosis not present

## 2024-09-05 ENCOUNTER — Other Ambulatory Visit (HOSPITAL_COMMUNITY): Payer: Self-pay | Admitting: Nurse Practitioner

## 2024-09-05 DIAGNOSIS — R519 Headache, unspecified: Secondary | ICD-10-CM

## 2024-09-06 ENCOUNTER — Ambulatory Visit (HOSPITAL_COMMUNITY)

## 2024-09-06 ENCOUNTER — Encounter (HOSPITAL_COMMUNITY): Payer: Self-pay

## 2024-09-07 DIAGNOSIS — L821 Other seborrheic keratosis: Secondary | ICD-10-CM | POA: Diagnosis not present

## 2024-09-07 DIAGNOSIS — L718 Other rosacea: Secondary | ICD-10-CM | POA: Diagnosis not present

## 2024-09-07 DIAGNOSIS — I788 Other diseases of capillaries: Secondary | ICD-10-CM | POA: Diagnosis not present

## 2024-09-08 NOTE — Progress Notes (Unsigned)
 Cardiology Office Note    Patient Name: Nakeeta Sebastiani [place] Date of Encounter: 09/08/2024  Primary Care Provider:  Rolinda Millman, MD Primary Cardiologist:  None Primary Electrophysiologist: None   Past Medical History    Past Medical History:  Diagnosis Date   Allergy    Arthritis    Atypical chest pain    Diverticulosis    Fibromyalgia    Heart palpitations    Hypertension    Osteoporosis     History of Present Illness  Taira Knabe is a 68 y.o. female with a PMH of HTN, moderate MR, palpitations, fibromyalgia follow-up.  Ms. Ledwell was seen initially by Dr. Elmira on 02/25/2023 for complaint of chest pain and palpitations.  She endorsed chest tightness at rest that resolved within minutes as well as palpitations and occasional skipped beats.  He completed a 14-day ZIO monitor that was predominantly sinus rhythm with 15 episodes of atrial tachycardia/SVT.  She also completed an exercise stress test that showed EKG abnormalities with ST depression in precordial leads suggestive of ischemia.  She completed a coronary CTA for further evaluation that showed a calcium  score of 0 with minimal soft plaque (0-29% stenosis) in the proximal LAD.  She also completed a 2D echo that showed moderate MR with EF of 60 to 65% and no RWMA but slightly dilated LA.  She was started on metoprolol  for management of palpitations.  Ms. Hada presents today with her husband for annual follow-up. She experiences strong heart palpitations primarily at night, sometimes accompanied by pain in her left arm and leg, as well as a pounding headache. These symptoms typically resolve after walking around for about an hour to an hour and a half. No significant chest discomfort, chest tightness, or chest pain since starting metoprolol . Occasional dizziness and a 'sweaty headache' have been noted since starting the medication. She began taking metoprolol  last week, which has helped improve her  symptoms at night. She takes 25 mg twice a day, once in the morning and once at night. Prior to this, she was not taking any medication for her palpitations. She experiences occasional 'flushes' where she feels her heart 'filling up' and then beating again, which she describes as a 'freaky feeling'. These episodes are brief and resolve on their own. She has a history of fibromyalgia, which affects her sleep, but notes improvement in sleep quality since starting metoprolol . She also has a history of ocular migraines. She reports swelling in her left leg, which she associates with her heart symptoms. The swelling has improved since starting metoprolol . Her father experienced significant swelling in his later years, and her mother has mild swelling but is not on blood pressure medication. She is very physically active and is the primary caregiver for her 79 year old mother, which has contributed to her stress levels. Patient denies chest pain, palpitations, dyspnea, PND, orthopnea, nausea, vomiting, dizziness, syncope, edema, weight gain, or early satiety.  Discussed the use of AI scribe software for clinical note transcription with the patient, who gave verbal consent to proceed.  History of Present Illness   Review of Systems  Please see the history of present illness.    All other systems reviewed and are otherwise negative except as noted above.  Physical Exam    Wt Readings from Last 3 Encounters:  06/08/23 123 lb (55.8 kg)  02/25/23 122 lb (55.3 kg)  12/23/16 123 lb (55.8 kg)   CD:Uyzmz were no vitals filed for this visit.,There is no height or weight on  file to calculate BMI. GEN: Well nourished, well developed in no acute distress Neck: No JVD; No carotid bruits Pulmonary: Clear to auscultation without rales, wheezing or rhonchi  Cardiovascular: Normal rate. Regular rhythm. Normal S1. Normal S2.   Murmurs: There is no murmur.  ABDOMEN: Soft, non-tender, non-distended EXTREMITIES:  No  edema; No deformity   EKG/LABS/ Recent Cardiac Studies   ECG personally reviewed by me today -none completed today  Risk Assessment/Calculations:          Lab Results  Component Value Date   WBC 4.6 03/09/2016   HGB 14.3 03/09/2016   HCT 39.3 03/09/2016   MCV 86.7 03/09/2016   PLT 269 02/11/2013   Lab Results  Component Value Date   CREATININE 0.58 04/01/2023   BUN 12 04/01/2023   NA 141 04/01/2023   K 4.5 04/01/2023   CL 103 04/01/2023   CO2 23 04/01/2023   No results found for: CHOL, HDL, LDLCALC, LDLDIRECT, TRIG, CHOLHDL  No results found for: HGBA1C Assessment & Plan    Assessment and Plan Assessment & Plan Mitral regurgitation, moderate, with left lower extremity edema Moderate mitral regurgitation with left leg edema. Metoprolol  improved symptoms. Requires monitoring for progression. - Order echocardiogram to assess mitral valve function and regurgitation progression. - Continue metoprolol  25 mg twice daily. - Monitor for severe regurgitation symptoms: dizziness, significant swelling, shortness of breath. - Consider fluid management if edema worsens.  Paroxysmal supraventricular tachycardia and atrial tachycardia with palpitations Paroxysmal supraventricular and atrial tachycardia with palpitations. Metoprolol  reduced frequency and severity. No significant arrhythmias on recent EKG. - Continue metoprolol  25 mg twice daily. - Monitor for side effects: fatigue, dizziness. - Educated on not abruptly discontinuing metoprolol  to avoid rebound hypertension. - Report any increase in symptoms or new symptoms: severe dizziness, syncope.  Atherosclerotic heart disease of native coronary artery without angina Atherosclerotic heart disease with minimal plaque burden. No angina. Preventative therapy recommended. - Continue lifestyle modifications. - Consider starting statin therapy if not already initiated.   1.  Nonrheumatic MR: Moderate mitral  regurgitation with left leg edema. Metoprolol  improved symptoms. Requires monitoring for progression. - Order echocardiogram to assess mitral valve function and regurgitation progression. - Continue metoprolol  25 mg twice daily. - Monitor for severe regurgitation symptoms: dizziness, significant swelling, shortness of breath. - Consider fluid management if edema worsens.  2.  Palpitations:  Metoprolol  reduced frequency and severity. No significant arrhythmias on recent EKG. - Continue metoprolol  25 mg twice daily. - Monitor for side effects: fatigue, dizziness. - Educated on not abruptly discontinuing metoprolol  to avoid rebound hypertension. - Report any increase in symptoms or new symptoms: severe dizziness, syncope.  3.  Nonobstructive CAD: - Coronary CTA completed 04/08/2023 showing 24% stenosis of soft plaque in the proximal segment of the LAD with calcium  score of 0 -No angina today at follow-up.   Preventative therapy recommended and lifestyle modifications recommended - Consider starting statin therapy in the future if cholesterol numbers are elevated.  4.  Lower extremity edema: Patient reports trace lower extremity edema that worsens with prolonged standing or sitting. - Patient advised to use conservative measures with elevation and compression stockings  Disposition: Follow-up with None or APP in 12 months    Signed, Wyn Raddle, Jackee Shove, NP 09/08/2024, 1:31 PM [facility]

## 2024-09-09 ENCOUNTER — Encounter: Payer: Self-pay | Admitting: Nurse Practitioner

## 2024-09-09 ENCOUNTER — Ambulatory Visit: Attending: Nurse Practitioner | Admitting: Nurse Practitioner

## 2024-09-09 VITALS — BP 104/64 | HR 62 | Ht 66.0 in | Wt 121.8 lb

## 2024-09-09 DIAGNOSIS — R002 Palpitations: Secondary | ICD-10-CM

## 2024-09-09 DIAGNOSIS — R600 Localized edema: Secondary | ICD-10-CM | POA: Diagnosis not present

## 2024-09-09 DIAGNOSIS — I2510 Atherosclerotic heart disease of native coronary artery without angina pectoris: Secondary | ICD-10-CM | POA: Diagnosis not present

## 2024-09-09 DIAGNOSIS — I340 Nonrheumatic mitral (valve) insufficiency: Secondary | ICD-10-CM

## 2024-09-09 MED ORDER — METOPROLOL TARTRATE 25 MG PO TABS: 25.0000 mg | ORAL_TABLET | Freq: Two times a day (BID) | ORAL | 2 refills | Status: DC

## 2024-09-09 NOTE — Patient Instructions (Signed)
 Medication Instructions:  Your physician recommends that you continue on your current medications as directed. Please refer to the Current Medication list given to you today. *If you need a refill on your cardiac medications before your next appointment, please call your pharmacy*  Lab Work: None ordered If you have labs (blood work) drawn today and your tests are completely normal, you will receive your results only by: MyChart Message (if you have MyChart) OR A paper copy in the mail If you have any lab test that is abnormal or we need to change your treatment, we will call you to review the results.  Testing/Procedures: Your physician has requested that you have an echocardiogram. Echocardiography is a painless test that uses sound waves to create images of your heart. It provides your doctor with information about the size and shape of your heart and how well your heart's chambers and valves are working. This procedure takes approximately one hour. There are no restrictions for this procedure. Please do NOT wear cologne, perfume, aftershave, or lotions (deodorant is allowed). Please arrive 15 minutes prior to your appointment time.  Please note: We ask at that you not bring children with you during ultrasound (echo/ vascular) testing. Due to room size and safety concerns, children are not allowed in the ultrasound rooms during exams. Our front office staff cannot provide observation of children in our lobby area while testing is being conducted. An adult accompanying a patient to their appointment will only be allowed in the ultrasound room at the discretion of the ultrasound technician under special circumstances. We apologize for any inconvenience.  Follow-Up: At [facility], you and your health needs are our priority.  As part of our continuing mission to provide you with exceptional heart care, our providers are all part of one team.  This team includes your primary Cardiologist  (physician) and Advanced Practice Providers or APPs (Physician Assistants and Nurse Practitioners) who all work together to provide you with the care you need, when you need it.  Your next appointment:   12 month(s)  Provider:   Newman Lawrence, MD or any APP   We recommend signing up for the patient portal called MyChart.  Sign up information is provided on this After Visit Summary.  MyChart is used to connect with patients for Virtual Visits (Telemedicine).  Patients are able to view lab/test results, encounter notes, upcoming appointments, etc.  Non-urgent messages can be sent to your provider as well.   To learn more about what you can do with MyChart, go to [email or web address].   Other Instructions

## 2024-09-15 DIAGNOSIS — K080 Exfoliation of teeth due to systemic causes: Secondary | ICD-10-CM | POA: Diagnosis not present

## 2024-10-07 ENCOUNTER — Encounter (HOSPITAL_BASED_OUTPATIENT_CLINIC_OR_DEPARTMENT_OTHER): Payer: Self-pay

## 2024-10-10 ENCOUNTER — Ambulatory Visit (INDEPENDENT_AMBULATORY_CARE_PROVIDER_SITE_OTHER)

## 2024-10-10 ENCOUNTER — Ambulatory Visit: Payer: Self-pay | Admitting: Nurse Practitioner

## 2024-10-10 DIAGNOSIS — I2510 Atherosclerotic heart disease of native coronary artery without angina pectoris: Secondary | ICD-10-CM

## 2024-10-10 DIAGNOSIS — R002 Palpitations: Secondary | ICD-10-CM

## 2024-10-10 DIAGNOSIS — I340 Nonrheumatic mitral (valve) insufficiency: Secondary | ICD-10-CM

## 2024-10-10 LAB — ECHOCARDIOGRAM COMPLETE
Area-P 1/2: 2.87 cm2
MV M vel: 4.79 m/s
MV Peak grad: 91.8 mmHg
Radius: 0.5 cm
S' Lateral: 2.92 cm

## 2024-10-10 NOTE — Progress Notes (Signed)
 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

## 2024-10-10 NOTE — Progress Notes (Signed)
 Called and spoke with patient regarding her recent echocardiogram results. Patient also has questions about a medication. Called transferred to Dr. Gilda nurse.

## 2024-10-18 ENCOUNTER — Encounter: Payer: Self-pay | Admitting: Cardiology

## 2024-10-18 ENCOUNTER — Ambulatory Visit: Attending: Cardiology | Admitting: Cardiology

## 2024-10-18 ENCOUNTER — Other Ambulatory Visit (HOSPITAL_COMMUNITY): Payer: Self-pay

## 2024-10-18 VITALS — BP 102/76 | HR 86 | Ht 65.5 in | Wt 126.0 lb

## 2024-10-18 DIAGNOSIS — I340 Nonrheumatic mitral (valve) insufficiency: Secondary | ICD-10-CM | POA: Diagnosis not present

## 2024-10-18 DIAGNOSIS — R002 Palpitations: Secondary | ICD-10-CM

## 2024-10-18 DIAGNOSIS — E782 Mixed hyperlipidemia: Secondary | ICD-10-CM | POA: Diagnosis not present

## 2024-10-18 MED ORDER — ROSUVASTATIN CALCIUM 10 MG PO TABS
10.0000 mg | ORAL_TABLET | Freq: Every day | ORAL | 3 refills | Status: AC
  Filled 2024-10-18: qty 90, 90d supply, fill #0

## 2024-10-18 MED ORDER — METOPROLOL SUCCINATE ER 25 MG PO TB24
25.0000 mg | ORAL_TABLET | Freq: Every day | ORAL | 3 refills | Status: DC
  Filled 2024-10-18: qty 90, 90d supply, fill #0

## 2024-10-18 MED ORDER — METOPROLOL SUCCINATE ER 25 MG PO TB24
25.0000 mg | ORAL_TABLET | Freq: Every evening | ORAL | 3 refills | Status: AC
  Filled 2024-10-18: qty 90, 90d supply, fill #0

## 2024-10-18 NOTE — Progress Notes (Signed)
 Cardiology Office Note:  .   Date:  10/18/2024  ID:  Kazue Cerro Shiloh, CHARLYNNE 12/14/1956, MRN [id number] PCP: Rolinda Millman, MD  [facility] Providers Cardiologist:  Newman Lawrence, MD PCP: Rolinda Millman, MD  Chief Complaint  Patient presents with   Palpitations     Jody Garcia is a 68 y.o. female with mixed hyperlipidemia, palpitations   History of Present Illness  Patient recently had symptoms of palpitations at night that have improved since starting metoprolol .  However, she has noticed her blood pressure is running lower than usual.  Reviewed recent echocardiogram and lipid panel results with the patient, details below.     Vitals:   10/18/24 0829  BP: 102/76  Pulse: 86  SpO2: 98%      Review of Systems  Cardiovascular:  Positive for palpitations (Improved). Negative for chest pain, dyspnea on exertion, leg swelling and syncope.        Studies Reviewed: .        Echocardiogram 09/2024:  1. Left ventricular ejection fraction, by estimation, is 60 to 65%. Left  ventricular ejection fraction by 3D volume is 63 %. The left ventricle has  normal function. The left ventricle has no regional wall motion  abnormalities. Left ventricular diastolic   parameters were normal. The average left ventricular global longitudinal  strain is -18.4 %. The global longitudinal strain is normal.   2. Right ventricular systolic function is normal. The right ventricular  size is normal. There is normal pulmonary artery systolic pressure. The  estimated right ventricular systolic pressure is 26.0 mmHg.   3. The mitral valve is grossly normal. Mild mitral valve regurgitation.  No evidence of mitral stenosis.   4. The aortic valve is tricuspid. Aortic valve regurgitation is not  visualized. No aortic stenosis is present.   5. The inferior vena cava is dilated in size with >50% respiratory  variability, suggesting right atrial pressure of 8 mmHg.     Mobile cardiac telemetry 8 days 02/25/2023 - 03/05/2023: Dominant rhythm: Sinus. HR 50-121 bpm. Avg HR 74 bpm. 15 episodes of atrial tachycardia/ SVT, fastest at 226 bpm for 8 beats, longest for 9 beats at 135 bpm. <1% isolated SVE, couplet/triplets. 0 episodes of VT. <1% isolated VE, couplets. No atrial fibrillation/atrial flutter/VT/high grade AV block, sinus pause >3sec noted. 0 patient triggered even  Coronary CTA 04/08/2023: 1. Total coronary calcium  score of 0. 2. Normal coronary origin with left dominance. 3. 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   Left Main: Patent. LAD: Minimal soft plaque (0-24% stenosis) within the proximal segment, mid to apical segments are patent. LCx: Patent. RCA: Patent.    Labs 08/2024: Chol 218, TG 56, HDL 94, LDL 126 Hb 13.9 Cr 0.7    Physical Exam Vitals and nursing note reviewed.  Constitutional:      General: She is not in acute distress. Neck:     Vascular: No JVD.  Cardiovascular:     Rate and Rhythm: Normal rate and regular rhythm.     Heart sounds: Normal heart sounds. No murmur heard. Pulmonary:     Effort: Pulmonary effort is normal.     Breath sounds: Normal breath sounds. No wheezing or rales.  Musculoskeletal:     Right lower leg: No edema.     Left lower leg: No edema.      VISIT DIAGNOSES:   ICD-10-CM   1. Mixed hyperlipidemia  E78.2 Lipid panel    Lipid panel    2. Palpitations  R00.2     3. Nonrheumatic mitral valve regurgitation  I34.0        Jody Garcia is a 68 y.o. female with mixed hyperlipidemia, palpitations Assessment & Plan  Palpitations: Previously noted to have brief episodes of atrial tachycardia/SVT on monitor.  Symptoms improved with metoprolol .  She currently takes tartrate 25 mg twice daily, and has noticed drop in blood pressure. Switch to metoprolol  succinate 25 mg at night instead.  If increasing symptoms of palpitations, we will get repeat monitor.  Mitral  regurgitation: Mild, does not need repeat surveillance.  Mixed hyperlipidemia: Noncalcified plaque noted on his CT in the past.  LDL is elevated.  In addition to heart healthy diet and lifestyle, she is open to starting statin.  Started Crestor  10 mg daily.  Repeat lipid panel in 3 months.      Meds ordered this encounter  Medications   DISCONTD: metoprolol  succinate (TOPROL  XL) 25 MG 24 hr tablet    Sig: Take 1 tablet (25 mg total) by mouth daily.    Dispense:  90 tablet    Refill:  3   rosuvastatin  (CRESTOR ) 10 MG tablet    Sig: Take 1 tablet (10 mg total) by mouth daily.    Dispense:  90 tablet    Refill:  3   metoprolol  succinate (TOPROL  XL) 25 MG 24 hr tablet    Sig: Take 1 tablet (25 mg total) by mouth at bedtime.    Dispense:  90 tablet    Refill:  3     F/u in 6 months  Signed, Newman JINNY Lawrence, MD

## 2024-10-18 NOTE — Patient Instructions (Signed)
 Medication Instructions:  START Crestor  10 mg daily  START Metoprolol  Succinate 25 mg nightly   STOP Metoprolol  Tartrate   *If you need a refill on your cardiac medications before your next appointment, please call your pharmacy*  Lab Work: Lipid panel in 3 months   If you have labs (blood work) drawn today and your tests are completely normal, you will receive your results only by: MyChart Message (if you have MyChart) OR A paper copy in the mail If you have any lab test that is abnormal or we need to change your treatment, we will call you to review the results.   Follow-Up: At [facility], you and your health needs are our priority.  As part of our continuing mission to provide you with exceptional heart care, our providers are all part of one team.  This team includes your primary Cardiologist (physician) and Advanced Practice Providers or APPs (Physician Assistants and Nurse Practitioners) who all work together to provide you with the care you need, when you need it.  Your next appointment:   6 month(s)  Provider:   Newman JINNY Lawrence, MD

## 2024-10-31 ENCOUNTER — Ambulatory Visit: Admitting: Cardiology

## 2024-12-29 ENCOUNTER — Telehealth: Payer: Self-pay | Admitting: Cardiology

## 2024-12-29 NOTE — Telephone Encounter (Signed)
 Pt contacted and advised. Pt agrees with plan of care.

## 2024-12-29 NOTE — Telephone Encounter (Signed)
 We can hold metoprolol  succinate 25 mg daily for now and see if symptoms improve. I doubt pain in the pelvis area is due to Crestor .  I agree with seeing OB/GYN PCP.  Thanks MJP

## 2024-12-29 NOTE — Telephone Encounter (Signed)
 Pt c/o medication issue:  1. Name of Medication: metoprolol  succinate (TOPROL  XL) 25 MG 24 hr tablet   2. How are you currently taking this medication (dosage and times per day)?    3. Are you having a reaction (difficulty breathing--STAT)? no  4. What is your medication issue? Call to discuss this medication with the nurse. Patient states she is becoming more tired with the medication. Please advise

## 2024-12-29 NOTE — Telephone Encounter (Signed)
 Spoke with pt. Felt very tired yesterday. Denies any other symptoms.  Pt then checked BP 1/7: 83/60 Did not take last nights dose of toprol  xl Bp today is: 117/74. Pt does not normally take Bps and does not have a BP log. Pt states she feels a lot better today. Advised pt to check BP tonight and I would send over to Dr Elmira to review and we would get back with recommendations.   Pt also stated she has been having some pain in her pelvis area. Pt states the pain comes and goes. Advised pt to speak with her PCP or OBGYN first and we can address the possibility it could be the crestor  if that looks good.   Pt stated understanding with those plans.

## 2025-01-04 LAB — LAB REPORT - SCANNED: EGFR: 96

## 2025-01-12 ENCOUNTER — Telehealth: Payer: Self-pay | Admitting: Cardiology

## 2025-01-12 NOTE — Telephone Encounter (Signed)
 Pt called in asking to speak with Dr. Gilda nurse. She states she did not wish to go into detail, but she has a couple concerns she wants to discuss.

## 2025-01-12 NOTE — Telephone Encounter (Signed)
 Contacted and advised. Pt agrees with plan of care.

## 2025-01-12 NOTE — Telephone Encounter (Signed)
 Patient states about 2 weeks ago she felt draggy like she had weights on her feet.  She noted her BP was 88/54. She stopped taking the metoprolol  succinate and started feeling better. However, her palpitations have been more bothersome off of the metoprolol .  Patient would like to know if there is a lower dose of Toprol  that she can take that will help her palpitations but not bring her BP down so much. Informed patient we will forward message to Dr. Elmira to review for further advisement.  BP today 116/76.  Patient states she also had lipid panel drawn at PCP's office. Called Dr. Zachery office and requested lipid panel results to be faxed to our office at [phone].

## 2025-01-12 NOTE — Telephone Encounter (Signed)
 Consider trying half tablet of metoprolol  succinate 25 mg daily to see if symptoms of palpitations improve and if blood pressure holds up okay.  Recommend at least 2 L of fluid intake daily.  Thanks MJP

## 2025-01-14 ENCOUNTER — Ambulatory Visit: Payer: Self-pay | Admitting: Cardiology

## 2025-03-06 ENCOUNTER — Ambulatory Visit
